# Patient Record
Sex: Female | Born: 1980 | Race: White | Hispanic: Yes | Marital: Married | Smoking: Never smoker
Health system: Southern US, Community
[De-identification: ages and names within clinical notes are randomized; demographics above are authoritative.]

## PROBLEM LIST (undated history)

## (undated) DIAGNOSIS — I10 Essential (primary) hypertension: Secondary | ICD-10-CM

## (undated) DIAGNOSIS — D649 Anemia, unspecified: Secondary | ICD-10-CM

## (undated) DIAGNOSIS — Z789 Other specified health status: Secondary | ICD-10-CM

## (undated) HISTORY — PX: OTHER SURGICAL HISTORY: SHX169

## (undated) HISTORY — DX: Other specified health status: Z78.9

## (undated) HISTORY — DX: Anemia, unspecified: D64.9

## (undated) HISTORY — DX: Essential (primary) hypertension: I10

---
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
## 2002-03-30 ENCOUNTER — Ambulatory Visit (HOSPITAL_COMMUNITY): Admission: RE | Admit: 2002-03-30 | Discharge: 2002-03-30 | Payer: Self-pay | Admitting: *Deleted

## 2002-07-24 ENCOUNTER — Inpatient Hospital Stay (HOSPITAL_COMMUNITY): Admission: AD | Admit: 2002-07-24 | Discharge: 2002-07-24 | Payer: Self-pay | Admitting: *Deleted

## 2002-07-31 ENCOUNTER — Inpatient Hospital Stay (HOSPITAL_COMMUNITY): Admission: AD | Admit: 2002-07-31 | Discharge: 2002-07-31 | Payer: Self-pay | Admitting: Family Medicine

## 2002-09-16 ENCOUNTER — Inpatient Hospital Stay (HOSPITAL_COMMUNITY): Admission: AD | Admit: 2002-09-16 | Discharge: 2002-09-18 | Payer: Self-pay | Admitting: *Deleted

## 2003-10-26 ENCOUNTER — Ambulatory Visit (HOSPITAL_COMMUNITY): Admission: RE | Admit: 2003-10-26 | Discharge: 2003-10-26 | Payer: Self-pay | Admitting: *Deleted

## 2003-11-22 ENCOUNTER — Ambulatory Visit (HOSPITAL_COMMUNITY): Admission: RE | Admit: 2003-11-22 | Discharge: 2003-11-22 | Payer: Self-pay | Admitting: *Deleted

## 2004-04-08 ENCOUNTER — Inpatient Hospital Stay (HOSPITAL_COMMUNITY): Admission: AD | Admit: 2004-04-08 | Discharge: 2004-04-11 | Payer: Self-pay | Admitting: *Deleted

## 2004-04-08 ENCOUNTER — Ambulatory Visit: Payer: Self-pay | Admitting: *Deleted

## 2006-10-12 ENCOUNTER — Ambulatory Visit: Payer: Self-pay | Admitting: Internal Medicine

## 2007-01-18 ENCOUNTER — Ambulatory Visit (HOSPITAL_COMMUNITY): Admission: RE | Admit: 2007-01-18 | Discharge: 2007-01-18 | Payer: Self-pay | Admitting: Obstetrics & Gynecology

## 2007-05-19 ENCOUNTER — Inpatient Hospital Stay (HOSPITAL_COMMUNITY): Admission: AD | Admit: 2007-05-19 | Discharge: 2007-05-20 | Payer: Self-pay | Admitting: Obstetrics & Gynecology

## 2007-05-19 ENCOUNTER — Ambulatory Visit: Payer: Self-pay | Admitting: *Deleted

## 2008-06-19 ENCOUNTER — Ambulatory Visit: Payer: Self-pay | Admitting: Internal Medicine

## 2008-06-19 ENCOUNTER — Encounter: Payer: Self-pay | Admitting: Internal Medicine

## 2008-06-20 ENCOUNTER — Ambulatory Visit: Payer: Self-pay | Admitting: *Deleted

## 2008-06-22 ENCOUNTER — Encounter: Admission: RE | Admit: 2008-06-22 | Discharge: 2008-06-22 | Payer: Self-pay | Admitting: Internal Medicine

## 2008-07-09 ENCOUNTER — Ambulatory Visit: Payer: Self-pay | Admitting: Internal Medicine

## 2008-09-18 ENCOUNTER — Telehealth (INDEPENDENT_AMBULATORY_CARE_PROVIDER_SITE_OTHER): Payer: Self-pay | Admitting: *Deleted

## 2008-10-12 ENCOUNTER — Ambulatory Visit: Payer: Self-pay | Admitting: Internal Medicine

## 2008-10-18 ENCOUNTER — Encounter: Admission: RE | Admit: 2008-10-18 | Discharge: 2008-10-18 | Payer: Self-pay | Admitting: Internal Medicine

## 2008-12-10 ENCOUNTER — Ambulatory Visit: Payer: Self-pay | Admitting: Internal Medicine

## 2008-12-31 ENCOUNTER — Ambulatory Visit (HOSPITAL_COMMUNITY): Admission: RE | Admit: 2008-12-31 | Discharge: 2008-12-31 | Payer: Self-pay | Admitting: General Surgery

## 2008-12-31 ENCOUNTER — Encounter (INDEPENDENT_AMBULATORY_CARE_PROVIDER_SITE_OTHER): Payer: Self-pay | Admitting: General Surgery

## 2009-05-10 ENCOUNTER — Ambulatory Visit: Payer: Self-pay | Admitting: Internal Medicine

## 2009-05-10 ENCOUNTER — Encounter (INDEPENDENT_AMBULATORY_CARE_PROVIDER_SITE_OTHER): Payer: Self-pay | Admitting: Adult Health

## 2009-05-10 LAB — CONVERTED CEMR LAB
ALT: 42 units/L — ABNORMAL HIGH (ref 0–35)
AST: 30 units/L (ref 0–37)
Albumin: 4.3 g/dL (ref 3.5–5.2)
Alkaline Phosphatase: 63 units/L (ref 39–117)
BUN: 10 mg/dL (ref 6–23)
Basophils Absolute: 0 10*3/uL (ref 0.0–0.1)
Basophils Relative: 0 % (ref 0–1)
CO2: 25 meq/L (ref 19–32)
Calcium: 9.3 mg/dL (ref 8.4–10.5)
Chlamydia, DNA Probe: NEGATIVE
Chloride: 105 meq/L (ref 96–112)
Cholesterol: 250 mg/dL — ABNORMAL HIGH (ref 0–200)
Creatinine, Ser: 0.51 mg/dL (ref 0.40–1.20)
Eosinophils Absolute: 0.4 10*3/uL (ref 0.0–0.7)
Eosinophils Relative: 5 % (ref 0–5)
GC Probe Amp, Genital: NEGATIVE
Glucose, Bld: 83 mg/dL (ref 70–99)
HCT: 40.7 % (ref 36.0–46.0)
HDL: 46 mg/dL (ref >39)
Hemoglobin: 13 g/dL (ref 12.0–15.0)
LDL Cholesterol: 163 mg/dL — ABNORMAL HIGH (ref 0–99)
Lymphocytes Relative: 43 % (ref 12–46)
Lymphs Abs: 2.9 10*3/uL (ref 0.7–4.0)
MCHC: 31.9 g/dL (ref 30.0–36.0)
MCV: 88.5 fL (ref 78.0–100.0)
Monocytes Absolute: 0.6 10*3/uL (ref 0.1–1.0)
Monocytes Relative: 9 % (ref 3–12)
Neutro Abs: 2.9 10*3/uL (ref 1.7–7.7)
Neutrophils Relative %: 42 % — ABNORMAL LOW (ref 43–77)
Pap Smear: NEGATIVE
Platelets: 337 10*3/uL (ref 150–400)
Potassium: 4.1 meq/L (ref 3.5–5.3)
RBC: 4.6 M/uL (ref 3.87–5.11)
RDW: 13.6 % (ref 11.5–15.5)
Sodium: 141 meq/L (ref 135–145)
Total Bilirubin: 0.3 mg/dL (ref 0.3–1.2)
Total CHOL/HDL Ratio: 5.4
Total Protein: 6.8 g/dL (ref 6.0–8.3)
Triglycerides: 205 mg/dL — ABNORMAL HIGH (ref <150)
VLDL: 41 mg/dL — ABNORMAL HIGH (ref 0–40)
WBC: 6.8 10*3/uL (ref 4.0–10.5)

## 2010-05-21 LAB — DIFFERENTIAL
Basophils Absolute: 0 10*3/uL (ref 0.0–0.1)
Basophils Relative: 1 % (ref 0–1)
Eosinophils Absolute: 0.4 10*3/uL (ref 0.0–0.7)
Eosinophils Relative: 4 % (ref 0–5)
Lymphocytes Relative: 47 % — ABNORMAL HIGH (ref 12–46)
Lymphs Abs: 4.2 10*3/uL — ABNORMAL HIGH (ref 0.7–4.0)
Monocytes Absolute: 0.6 10*3/uL (ref 0.1–1.0)
Monocytes Relative: 7 % (ref 3–12)
Neutro Abs: 3.7 10*3/uL (ref 1.7–7.7)
Neutrophils Relative %: 42 % — ABNORMAL LOW (ref 43–77)

## 2010-05-21 LAB — CBC
HCT: 41.3 % (ref 36.0–46.0)
Hemoglobin: 14.1 g/dL (ref 12.0–15.0)
MCHC: 34.2 g/dL (ref 30.0–36.0)
MCV: 88.8 fL (ref 78.0–100.0)
Platelets: 310 10*3/uL (ref 150–400)
RBC: 4.65 MIL/uL (ref 3.87–5.11)
RDW: 12.7 % (ref 11.5–15.5)
WBC: 8.9 10*3/uL (ref 4.0–10.5)

## 2010-11-11 LAB — CBC
HCT: 38.1
Hemoglobin: 12.8
MCHC: 33.7
MCV: 89.4
Platelets: 240
RBC: 4.27
RDW: 13.9
WBC: 9.9

## 2010-11-11 LAB — RPR: RPR Ser Ql: NONREACTIVE

## 2011-01-29 ENCOUNTER — Other Ambulatory Visit: Payer: Self-pay | Admitting: Family Medicine

## 2011-06-15 ENCOUNTER — Emergency Department (HOSPITAL_COMMUNITY)
Admission: EM | Admit: 2011-06-15 | Discharge: 2011-06-15 | Disposition: A | Discharge status: Home / Self Care | Payer: Self-pay | Source: Home / Self Care | Attending: Emergency Medicine | Admitting: Emergency Medicine

## 2011-06-15 ENCOUNTER — Encounter (HOSPITAL_COMMUNITY): Payer: Self-pay

## 2011-06-15 DIAGNOSIS — J029 Acute pharyngitis, unspecified: Secondary | ICD-10-CM

## 2011-06-15 DIAGNOSIS — I889 Nonspecific lymphadenitis, unspecified: Secondary | ICD-10-CM

## 2011-06-15 LAB — POCT RAPID STREP A: Streptococcus, Group A Screen (Direct): POSITIVE — AB

## 2011-06-15 MED ORDER — PENICILLIN V POTASSIUM 500 MG PO TABS: 500.0000 mg | ORAL_TABLET | Freq: Four times a day (QID) | ORAL | Status: AC

## 2011-06-15 MED ORDER — HYDROCODONE-ACETAMINOPHEN 7.5-500 MG/15ML PO SOLN
15.0000 mL | Freq: Three times a day (TID) | ORAL | Status: AC | PRN
Start: 2011-06-15 — End: 2011-06-25

## 2011-06-15 NOTE — ED Notes (Signed)
Throat red and swollen

## 2011-06-15 NOTE — Discharge Instructions (Signed)
Regrese [employer] dias para asegurarnos que  esta infeccion esta mejorando. Si sus sintomas empeoran o no pudiera usted tragar solidos o liquidos tendra que ir al servicio de emergencias   Faringitis (viral y bacteriana) (Pharyngitis, Viral and Bacterial) Es una inflamacin (irritacin) o infeccin de la faringe. Tambin se denomina dolor de garganta.  CAUSAS La mayor parte de las anginas son causadas por virus y son parte de un resfro. Sin embargo, algunas anginas son ocasionadas por la bacteria estreptococo (germen) o por otras bacterias. La causa de la angina tambin puede ser el goteo nasal proveniente de los senos [place], [place] y, en algunos [place], se produce incluso por dormir con la boca abierta. Las anginas infecciosas pueden contagiarse de Neomia Dear persona a otra por la tos, el estornudo y por compartir tasas o cubiertos. TRATAMIENTO Las anginas de causa viral generalmente duran entre 3 y [address] Estas infecciones virales generalmente mejoran sin antibiticos (medicamentos que destruyen grmenes). Las anginas por estreptococo y otras bacterias (grmenes) generalmente mejoran despus de 24 a 48 horas de Microbiologist con antibiticos. INSTRUCCIONES PARA EL CUIDADO DOMICILIARIO  Si en profesional considera que se trata de una infeccin bacteriana o si hay una prueba positiva para Event organiser, Administrator, sports un antibitico. Debe tomar todos los antibiticos Librarian, academic. Si no completa el tratamiento con antibiticos, usted o su hijo podrn enfermar nuevamente. Si usted o su hijo presentan un estreptococo en la garganta y no completan el tratamiento, podran sufrir un trastorno grave en el corazn o en los riones.   Beba gran cantidad de lquidos. Alrededor de 8-10 vasos grandes de agua por da. (Puede ser agua, jugos, licuados de frutas, Kool-aid, [place], [place], etc.).   Utilice los medicamentos de venta libre o de prescripcin para Chief Technology Officer, Copywriter, advertising o la [place], segn se lo indique el profesional que lo asiste.   Descanse lo suficiente.   Hgase grgaras con agua salada (1/2 cucharadita de sal en un vaso de agua) cada 1-2 horas si lo necesita para [employer].   Si el paciente es mayor de [place], ofrzcale caramelos duros o Engineer, manufacturing o pastillas para la tos.  SOLICITE ATENCIN MDICA SI:  Si le aparecen bultos grandes y dolorosos en el cuello.   Presenta una erupcin.   Elimina un esputo verde, marrn-amarillento o sanguinolento.   El beb tiene ms de 3 meses y su temperatura rectal es de 100.5 F (38.1 C) o ms durante ms de 1 da.  SOLICITE ATENCIN MDICA DE INMEDIATO SI:  Siente rigidez en el cuello.   Usted o su hijo babean o no pueden tragar lquidos.   Usted o su hijo vomitan, no pueden retener los [employer].   Usted o su hijo presentan dolor intenso, que no se alivia con los [employer] han recetado.   Usted o su hijo presentan dificultad para respirar (y no se debe a la [place]).   Usted o su hijo no pueden abrir Scientist, product/process development.   Usted o su nio presentan aumento del dolor, hinchazn, enrojecimiento en el cuello.   Tiene fiebre.   Su beb tiene ms de 3 meses y su temperatura rectal es de 102 F (38.9 C) o ms.   Su beb tiene 3 meses o menos y su temperatura rectal es de 100.4 F (38 C) o ms.  EST SEGURO QUE:   Comprende las instrucciones para el alta mdica.   Controlar su enfermedad.  Solicitar atencin mdica de inmediato segn las indicaciones.  Document Released: 11/12/2004 Document Revised: 01/22/2011 [facility] Patient Information 2012 [place], [place].Ganglios linfticos inflamados (Swollen Lymph Nodes) El sistema linftico filtra los lquidos que rodean a las clulas. Es similar al sistema de vasos sanguneos. Estos canales transportan linfa en vez de sangre. El sistema linftico es una parte importante del sistema  inmunitario (el que lucha contra las enfermedades). Cuando las personas hablan de "glndulas inflamadas en el cuello" generalmente se refieren a la inflamacin de los ganglios linfticos. Los ganglios linfticos son como pequeas trampas para las infecciones. Usted y Mining engineer que lo asiste pueden palpar los ganglios linfticos, especialmente los que estn inflamados, en estas zonas: en la ingle, en la axila y en la zona que se encuentra por encima de la clavcula. Tambin podr palparlos en el cuello (zona cervical) y en la parte posterior de la cabeza, justo por encima de la lnea del cuero cabelludo (zona occipital). Los ganglios linfticos inflamados aparecen cuando hay alguna enfermedad en la que el organismo responde con cierto tipo de Automotive engineer. Por ejemplo, los ganglios del cuello pueden inflamarse por la picadura de un insecto o por cualquier infeccin menor en la cabeza. Estos son muy fciles de advertir en los nios que sufren un trastorno leve. Los ganglios linfticos tambin se inflaman cuando hay un tumor o un problema con el sistema linftico, como en la enfermedad de Hodgkin. TRATAMIENTO  La mayor parte de los ganglios inflamados no requieren TEFL teacher. Tendrn que observarse durante un breve perodo, si el profesional lo considera necesario. En general, no es necesaria la observacin.   El profesional que lo asiste podr prescribirle antibiticos (medicamentos que destruyen grmenes). El profesional podr prescribirlos si considera que la inflamacin de los ganglios se debe a una infeccin bacteriana (por grmenes). Los antibiticos no se utilizan si la causa de la inflamacin es un virus.  INSTRUCCIONES PARA EL CUIDADO DOMICILIARIO  Tome los [employer] indic el profesional que lo asiste. Utilice los medicamentos de venta libre o de prescripcin para Chief Technology Officer, Environmental health practitioner o la [place], segn se lo indique el profesional que lo asiste.  SOLICITE ATENCIN MDICA  SI:  La temperatura se eleva sin motivo por encima de 102 F (38.9 C) o segn le indique el profesional que lo asiste.  EST SEGURO QUE:   Comprende las instrucciones para el alta mdica.   Controlar su enfermedad.   Solicitar atencin mdica de inmediato segn las indicaciones.  Document Released: 11/12/2004 Document Revised: 01/22/2011 [facility] Patient Information 2012 [place], [place].Ganglios linfticos inflamados (Swollen Lymph Nodes) El sistema linftico filtra los lquidos que rodean a las clulas. Es similar al sistema de vasos sanguneos. Estos canales transportan linfa en vez de sangre. El sistema linftico es una parte importante del sistema inmunitario (el que lucha contra las enfermedades). Cuando las personas hablan de "glndulas inflamadas en el cuello" generalmente se refieren a la inflamacin de los ganglios linfticos. Los ganglios linfticos son como pequeas trampas para las infecciones. Usted y Mining engineer que lo asiste pueden palpar los ganglios linfticos, especialmente los que estn inflamados, en estas zonas: en la ingle, en la axila y en la zona que se encuentra por encima de la clavcula. Tambin podr palparlos en el cuello (zona cervical) y en la parte posterior de la cabeza, justo por encima de la lnea del cuero cabelludo (zona occipital). Los ganglios linfticos inflamados aparecen cuando hay alguna enfermedad en la que el organismo responde con cierto tipo de reaccin  alrgica. Por ejemplo, los ganglios del cuello pueden inflamarse por la picadura de un insecto o por cualquier infeccin menor en la cabeza. Estos son muy fciles de advertir en los nios que sufren un trastorno leve. Los ganglios linfticos tambin se inflaman cuando hay un tumor o un problema con el sistema linftico, como en la enfermedad de Hodgkin. TRATAMIENTO  La mayor parte de los ganglios inflamados no requieren TEFL teacher. Tendrn que observarse durante un breve perodo, si el profesional  lo considera necesario. En general, no es necesaria la observacin.   El profesional que lo asiste podr prescribirle antibiticos (medicamentos que destruyen grmenes). El profesional podr prescribirlos si considera que la inflamacin de los ganglios se debe a una infeccin bacteriana (por grmenes). Los antibiticos no se utilizan si la causa de la inflamacin es un virus.  INSTRUCCIONES PARA EL CUIDADO DOMICILIARIO  Tome los [employer] indic el profesional que lo asiste. Utilice los medicamentos de venta libre o de prescripcin para Chief Technology Officer, Environmental health practitioner o la [place], segn se lo indique el profesional que lo asiste.  SOLICITE ATENCIN MDICA SI:  La temperatura se eleva sin motivo por encima de 102 F (38.9 C) o segn le indique el profesional que lo asiste.  EST SEGURO QUE:   Comprende las instrucciones para el alta mdica.   Controlar su enfermedad.   Solicitar atencin mdica de inmediato segn las indicaciones.  Document Released: 11/12/2004 Document Revised: 01/22/2011 [facility] Patient Information 2012 [place], [place].

## 2011-06-15 NOTE — ED Provider Notes (Signed)
History     CSN: [id number]  Arrival date & time 06/15/11  1052   First MD Initiated Contact with Patient 06/15/11 1121      Chief Complaint  Patient presents with  . Sore Throat  . Otalgia    (Consider location/radiation/quality/duration/timing/severity/associated sxs/prior treatment) HPI Comments: Patient presents urgent care today complaining of severe sore throat and neck pain that started since Friday. She believes Friday she felt a fever but not felt any further fever since then. She denies any cough or upper congestion. It is just strictly hurts to swallow. She has been able to drink fluids and knee but is very painful when she swallows.  Patient denies cough, shortness of breath abdominal pain or vomiting. Taking some Advil and Tylenol partial relief symptoms seem to be getting worse today is why she decided to come in.  Patient is a 31 y.o. female presenting with pharyngitis and ear pain. The history is provided by the patient.  Sore Throat This is a new problem. The current episode started more than 2 days ago. The problem occurs constantly. The problem has been gradually worsening. Pertinent negatives include no abdominal pain, no headaches and no shortness of breath. The symptoms are aggravated by swallowing. The symptoms are relieved by acetaminophen. She has tried acetaminophen for the symptoms. The treatment provided no relief.  Otalgia Associated symptoms include sore throat. Pertinent negatives include no headaches, no rhinorrhea, no abdominal pain, no cough and no rash.    History reviewed. No pertinent past medical history.  History reviewed. No pertinent past surgical history.  No family history on file.  History  Substance Use Topics  . Smoking status: Never Smoker   . Smokeless tobacco: Not on file  . Alcohol Use: No    OB History    Grav Para Term Preterm Abortions TAB SAB Ect Mult Living                  Review of Systems  Constitutional:  Positive for fever and appetite change. Negative for chills and diaphoresis.  HENT: Positive for ear pain and sore throat. Negative for congestion, rhinorrhea and voice change.   Respiratory: Negative for cough and shortness of breath.   Gastrointestinal: Negative for abdominal pain.  Skin: Negative for rash.  Neurological: Negative for headaches.    Allergies  Review of patient's allergies indicates no known allergies.  Home Medications   Current Outpatient Rx  Name Route Sig Dispense Refill  . HYDROCODONE-ACETAMINOPHEN 7.5-500 MG/15ML PO SOLN Oral Take 15 mLs by mouth every 8 (eight) hours as needed for pain. 120 mL 0  . PENICILLIN V POTASSIUM 500 MG PO TABS Oral Take 1 tablet (500 mg total) by mouth 4 (four) times daily. 40 tablet 0    BP 125/73  Pulse 64  Temp(Src) 98.3 F (36.8 C) (Oral)  Resp 16  SpO2 100%  LMP 05/25/2011  Physical Exam  Nursing note and vitals reviewed. Constitutional: She appears well-developed. She appears distressed.  HENT:  Head: Normocephalic.  Mouth/Throat: Uvula is midline and mucous membranes are normal. Posterior oropharyngeal erythema present. No tonsillar abscesses.    Eyes: Conjunctivae are normal.  Neck: Trachea normal and normal range of motion. Neck supple. No JVD present. No mass present.    Pulmonary/Chest: Effort normal and breath sounds normal. No respiratory distress. She has no wheezes. She has no rales. She exhibits no tenderness.  Abdominal: Soft.  Lymphadenopathy:    She has cervical adenopathy.  Neurological: She is alert.  Skin: No rash noted. No erythema.    ED Course  Procedures (including critical care time)  Labs Reviewed  POCT RAPID STREP A ([facility] URG CARE ONLY) - Abnormal; Notable for the following:    Streptococcus, Group A Screen (Direct) POSITIVE (*)    All other components within normal limits   No results found.   1. Pharyngitis   2. Lymphadenitis       MDM  Pharyngitis with reactive  lymphadenopathy anterior neck. Mild tonsillitis as well. Have been symptomatic for 4 days. Patient is able to swallow fluids and solids but with pain. Tonsillitis is++ bilaterally with reactive lymphadenopathy. Patient encouraged to return in 48 hours for recheck and advised that if worsening swelling or difficulty swallowing or salivating that she should go to the emergency department. Patient agreed with treatment plan turn into days for recheck  Jimmie Molly, MD 06/15/11 1458

## 2011-06-15 NOTE — ED Notes (Signed)
C/o sore throat and ear pain since Friday.  States she thinks she had fever on Friday.  SX are getting worse.

## 2015-02-08 ENCOUNTER — Encounter (HOSPITAL_COMMUNITY): Payer: Self-pay | Admitting: *Deleted

## 2015-02-08 ENCOUNTER — Emergency Department (HOSPITAL_COMMUNITY)
Admission: EM | Admit: 2015-02-08 | Discharge: 2015-02-08 | Disposition: A | Discharge status: Home / Self Care | Payer: No Typology Code available for payment source | Attending: Emergency Medicine | Admitting: Emergency Medicine

## 2015-02-08 ENCOUNTER — Emergency Department (HOSPITAL_COMMUNITY): Payer: No Typology Code available for payment source

## 2015-02-08 DIAGNOSIS — Y998 Other external cause status: Secondary | ICD-10-CM | POA: Diagnosis not present

## 2015-02-08 DIAGNOSIS — S161XXA Strain of muscle, fascia and tendon at neck level, initial encounter: Secondary | ICD-10-CM

## 2015-02-08 DIAGNOSIS — S199XXA Unspecified injury of neck, initial encounter: Secondary | ICD-10-CM | POA: Diagnosis present

## 2015-02-08 DIAGNOSIS — S0990XA Unspecified injury of head, initial encounter: Secondary | ICD-10-CM | POA: Diagnosis not present

## 2015-02-08 DIAGNOSIS — Y9389 Activity, other specified: Secondary | ICD-10-CM | POA: Diagnosis not present

## 2015-02-08 DIAGNOSIS — Y92410 Unspecified street and highway as the place of occurrence of the external cause: Secondary | ICD-10-CM | POA: Insufficient documentation

## 2015-02-08 DIAGNOSIS — S4992XA Unspecified injury of left shoulder and upper arm, initial encounter: Secondary | ICD-10-CM | POA: Diagnosis not present

## 2015-02-08 DIAGNOSIS — M25512 Pain in left shoulder: Secondary | ICD-10-CM

## 2015-02-08 MED ORDER — CYCLOBENZAPRINE HCL 10 MG PO TABS
5.0000 mg | ORAL_TABLET | Freq: Once | ORAL | Status: AC
  Administered 2015-02-08: 5 mg via ORAL
  Filled 2015-02-08: qty 1

## 2015-02-08 MED ORDER — IBUPROFEN 600 MG PO TABS: 600.0000 mg | ORAL_TABLET | Freq: Three times a day (TID) | ORAL | Status: DC

## 2015-02-08 MED ORDER — CYCLOBENZAPRINE HCL 5 MG PO TABS: 5.0000 mg | ORAL_TABLET | Freq: Three times a day (TID) | ORAL | Status: DC | PRN
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
## 2019-01-06 ENCOUNTER — Other Ambulatory Visit: Payer: Self-pay

## 2019-01-06 ENCOUNTER — Ambulatory Visit: Payer: Self-pay | Attending: Family Medicine | Admitting: Family Medicine

## 2019-01-06 ENCOUNTER — Encounter: Payer: Self-pay | Admitting: Family Medicine

## 2019-01-06 VITALS — BP 161/99 | HR 72 | Temp 98.2°F | Resp 18 | Ht 60.0 in | Wt 158.0 lb

## 2019-01-06 DIAGNOSIS — R350 Frequency of micturition: Secondary | ICD-10-CM

## 2019-01-06 DIAGNOSIS — R5383 Other fatigue: Secondary | ICD-10-CM

## 2019-01-06 DIAGNOSIS — Z603 Acculturation difficulty: Secondary | ICD-10-CM

## 2019-01-06 DIAGNOSIS — Z758 Other problems related to medical facilities and other health care: Secondary | ICD-10-CM

## 2019-01-06 DIAGNOSIS — Z789 Other specified health status: Secondary | ICD-10-CM

## 2019-01-06 DIAGNOSIS — R1030 Lower abdominal pain, unspecified: Secondary | ICD-10-CM

## 2019-01-06 DIAGNOSIS — N926 Irregular menstruation, unspecified: Secondary | ICD-10-CM

## 2019-01-06 DIAGNOSIS — R079 Chest pain, unspecified: Secondary | ICD-10-CM

## 2019-01-06 DIAGNOSIS — I10 Essential (primary) hypertension: Secondary | ICD-10-CM

## 2019-01-06 DIAGNOSIS — R519 Headache, unspecified: Secondary | ICD-10-CM

## 2019-01-06 LAB — POCT URINALYSIS DIP (CLINITEK)
Bilirubin, UA: NEGATIVE
Blood, UA: NEGATIVE
Glucose, UA: NEGATIVE mg/dL
Ketones, POC UA: NEGATIVE mg/dL
Leukocytes, UA: NEGATIVE
Nitrite, UA: NEGATIVE
POC PROTEIN,UA: NEGATIVE
Spec Grav, UA: 1.015
Urobilinogen, UA: 0.2 U/dL
pH, UA: 6.5

## 2019-01-06 MED ORDER — AMLODIPINE BESYLATE 5 MG PO TABS: 5.0000 mg | ORAL_TABLET | Freq: Every day | ORAL | 3 refills | Status: DC

## 2019-01-06 NOTE — Patient Instructions (Signed)
Hipertensin en los adultos Hypertension, Adult El trmino hipertensin es otra forma de denominar a la presin arterial elevada. La presin arterial elevada fuerza al corazn a trabajar ms para bombear la sangre. Esto puede causar problemas con el paso del tiempo. Una lectura de presin arterial est compuesta por 2 nmeros. Hay un nmero superior (sistlico) sobre un nmero inferior (diastlico). Lo ideal es tener la presin arterial por debajo de 120/80. Las elecciones saludables pueden ayudar a bajar la presin arterial, o tal vez necesite medicamentos para bajarla. Cules son las causas? Se desconoce la causa de esta afeccin. Algunas afecciones pueden estar relacionadas con la presin arterial alta. Qu incrementa el riesgo?  Fumar.  Tener diabetes mellitus tipo 2, colesterol alto, o ambos.  No hacer la cantidad suficiente de actividad fsica o ejercicio.  Tener sobrepeso.  Consumir mucha grasa, azcar, caloras o sal (sodio) en su dieta.  Beber alcohol en exceso.  Tener una enfermedad renal a largo plazo (crnica).  Tener antecedentes familiares de presin arterial alta.  Edad. Los riesgos aumentan con la edad.  Raza. El riesgo es mayor para las personas afroamericanas.  Sexo. Antes de los 45aos, los hombres corren ms riesgo que las mujeres. Despus de los 65aos, las mujeres corren ms riesgo que los hombres.  Tener apnea obstructiva del sueo.  Estrs. Cules son los signos o los sntomas?  Es posible que la presin arterial alta puede no cause sntomas. La presin arterial muy alta (crisis hipertensiva) puede provocar: ? Dolor de cabeza. ? Sensaciones de preocupacin o nerviosismo (ansiedad). ? Falta de aire. ? Hemorragia nasal. ? Sensacin de malestar en el estmago (nuseas). ? Vmitos. ? Cambios en la forma de ver. ? Dolor muy intenso en el pecho. ? Convulsiones. Cmo se trata?  Esta afeccin se trata haciendo cambios saludables en el estilo de  vida, por ejemplo: ? Consumir alimentos saludables. ? Hacer ms ejercicio. ? Beber menos alcohol.  El mdico puede recetarle medicamentos si los cambios en el estilo de vida no son suficientes para lograr controlar la presin arterial y si: ? El nmero de arriba est por encima de 130. ? El nmero de abajo est por encima de 80.  Su presin arterial personal ideal puede variar. Siga estas instrucciones en su casa: Comida y bebida   Si se lo dicen, siga el plan de alimentacin de DASH (Dietary Approaches to Stop Hypertension, Maneras de alimentarse para detener la hipertensin). Para seguir este plan: ? Llene la mitad del plato de cada comida con frutas y verduras. ? Llene un cuarto del plato de cada comida con cereales integrales. Los cereales integrales incluyen pasta integral, arroz integral y pan integral. ? Coma y beba productos lcteos con bajo contenido de grasa, como leche descremada o yogur bajo en grasas. ? Llene un cuarto del plato de cada comida con protenas bajas en grasa (magras). Las protenas bajas en grasa incluyen pescado, pollo sin piel, huevos, frijoles y tofu. ? Evite consumir carne grasa, carne curada y procesada, o pollo con piel. ? Evite consumir alimentos prehechos o procesados.  Consuma menos de 1500 mg de sal por da.  No beba alcohol si: ? El mdico le indica que no lo haga. ? Est embarazada, puede estar embarazada o est tratando de quedar embarazada.  Si bebe alcohol: ? Limite la cantidad que bebe a lo siguiente:  De 0 a 1 medida por da para las mujeres.  De 0 a 2 medidas por da para los hombres. ? Est atento a   la cantidad de alcohol que hay en las bebidas que toma. En [place], una medida equivale a una botella de cerveza de 12oz (355ml), un vaso de vino de 5oz (148ml) o un vaso de una bebida alcohlica de alta graduacin de 1oz (44ml). Estilo de vida   Trabaje con su mdico para mantenerse en un peso saludable o para perder  peso. Pregntele a su mdico cul es el peso recomendable para usted.  Haga al menos 30minutos de ejercicio la mayora de los das de la semana. Estos pueden incluir caminar, nadar o andar en bicicleta.  Realice al menos 30 minutos de ejercicio que fortalezca sus msculos (ejercicios de resistencia) al menos 3 das a la semana. Estos pueden incluir levantar pesas o hacer Pilates.  No consuma ningn producto que contenga nicotina o tabaco, como cigarrillos, cigarrillos electrnicos y tabaco de mascar. Si necesita ayuda para dejar de fumar, consulte al mdico.  Controle su presin arterial en su casa tal como le indic el mdico.  Concurra a todas las visitas de seguimiento como se lo haya indicado el mdico. Esto es importante. Medicamentos  Tome los medicamentos de venta libre y los recetados solamente como se lo haya indicado el mdico. Siga cuidadosamente las indicaciones.  No omita las dosis de medicamentos para la presin arterial. Los medicamentos pierden eficacia si omite dosis. El hecho de omitir las dosis tambin aumenta el riesgo de otros problemas.  Pregntele a su mdico a qu efectos secundarios o reacciones a los medicamentos debe prestar atencin. Comunquese con un mdico si:  Piensa que tiene una reaccin a los medicamentos que est tomando.  Tiene dolores de cabeza frecuentes (recurrentes).  Se siente mareado.  Tiene hinchazn en los tobillos.  Tiene problemas de visin. Solicite ayuda inmediatamente si:  Siente un dolor de cabeza muy intenso.  Empieza a sentirse desorientado (confundido).  Se siente dbil o adormecido.  Siente que va a desmayarse.  Tiene un dolor muy intenso en las siguientes zonas: ? Pecho. ? Vientre (abdomen).  Vomita ms de una vez.  Tiene dificultad para respirar. Resumen  El trmino hipertensin es otra forma de denominar a la presin arterial elevada.  La presin arterial elevada fuerza al corazn a trabajar ms para bombear  la sangre.  Para la mayora de las personas, una presin arterial normal es menor que 120/80.  Las decisiones saludables pueden ayudarle a disminuir su presin arterial. Si no puede bajar su presin arterial mediante decisiones saludables, es posible que deba tomar medicamentos. Esta informacin no tiene como fin reemplazar el consejo del mdico. Asegrese de hacerle al mdico cualquier pregunta que tenga. Document Released: 07/23/2009 Document Revised: 11/18/2017 Document Reviewed: 11/18/2017 Elsevier Patient Education  2020 [employer] Inc.  

## 2019-01-06 NOTE — Progress Notes (Signed)
Subjective:  Patient ID: Tracey Gill, female    DOB: 08-Mar-1980  Age: 38 y.o. MRN: [id number]   Due to language barrier, [employer] video interpretation system used at today's visit  CC: Hypertension   HPI Tracey Gill, 38 year old Hispanic female, who is new to the practice who presents secondary to complaint of chronic daily headaches.  She also reports issues with abdominal pain which occurs in her right lower abdomen and middle of the abdomen.  She also has had issues with ongoing fatigue.  She reports no prior medical history/medical issues.  She is not currently on any chronic medications.  She reports that headaches are generalized and dull, sometimes slightly throbbing.  In the past, headaches were occurring randomly and less frequently but over the past month or so, she feels as if she has a headache on a daily basis.  Headaches can range from a 6-8 on a 0-to-10 scale.  Headaches are sometimes slightly decreased with use of over-the-counter medication.  She also occasionally has dizziness when she has headaches.  Dizziness tends to occur with changes in position.  Dizziness does not last more than a few seconds.  She denies any changes in vision or hearing.  She has had no episodes of focal numbness or weakness.         She reports that fatigue has been ongoing greater than 6 months.  She just feels tired in general.  She feels that she is sleeping well/gets adequate sleep.  She does tend to worry somewhat.  She does not believe that she is currently pregnant.  She does have issues with irregular menses however but had her last menses on 12/22/2018.  She denies shortness of breath or cough.  She does occasionally have some pain in her chest which can occur on both sides.  Chest pain occurs randomly.  Chest pain is more likely to occur after she has been more active.  She did recently start lifting weights at home for exercise.         She reports that her lower abdominal pain is  slightly crampy in nature.  Abdominal pain ranges from about a 4-6 on a 0-to-10 scale.  She denies any current nausea or vomiting, no fever or chills.  No constipation or diarrhea.  She has had no blood in the stool and no dark stools.  She has noticed that she has to urinate more frequently.  She does not feel discomfort with urination but sometimes feels as if she has mild increase lower abdominal pressure prior to urination.  Past Medical History:  Diagnosis Date  . Known health problems: none     Past Surgical History:  Procedure Laterality Date  . removal of benign right breast mass      Family History  Problem Relation Age of Onset  . Diabetes Mother   . Heart disease Father   . Diabetes Sister     Social History   Tobacco Use  . Smoking status: Never Smoker  . Smokeless tobacco: Never Used  Substance Use Topics  . Alcohol use: No    ROS Review of Systems  Constitutional: Positive for fatigue. Negative for chills and fever.  HENT: Negative for sore throat and trouble swallowing.   Eyes: Negative for photophobia and visual disturbance.  Cardiovascular: Positive for chest pain. Negative for palpitations and leg swelling.  Gastrointestinal: Positive for abdominal pain (across right and mid lower abdomen). Negative for blood in stool, constipation, diarrhea and nausea.  Endocrine: Positive  for polyuria. Negative for cold intolerance, heat intolerance, polydipsia and polyphagia.  Genitourinary: Positive for frequency. Negative for dysuria.  Musculoskeletal: Negative for arthralgias and back pain.  Skin: Negative for rash and wound.  Neurological: Positive for dizziness and headaches.  Hematological: Negative for adenopathy. Does not bruise/bleed easily.    Objective:   Today's Vitals: BP (!) 161/99 (BP Location: Left Arm, Patient Position: Sitting, Cuff Size: Normal)   Pulse 72   Temp 98.2 F (36.8 C) (Oral)   Resp 18   Ht 5' (1.524 m)   Wt 158 lb (71.7 kg)   LMP  12/22/2018   SpO2 99%   BMI 30.86 kg/m  repeat BP 163/103  Physical Exam Constitutional:      General: She is not in acute distress.    Appearance: Normal appearance. She is normal weight. She is not ill-appearing.  Neck:     Musculoskeletal: Normal range of motion and neck supple. No neck rigidity or muscular tenderness.     Vascular: No carotid bruit.  Cardiovascular:     Rate and Rhythm: Normal rate and regular rhythm.  Pulmonary:     Effort: Pulmonary effort is normal.     Breath sounds: Normal breath sounds.  Abdominal:     Palpations: Abdomen is soft.     Tenderness: There is abdominal tenderness (right lower and suprapubic area). There is no right CVA tenderness, left CVA tenderness, guarding or rebound.  Musculoskeletal:        General: No tenderness or deformity.     Right lower leg: No edema.     Left lower leg: No edema.  Lymphadenopathy:     Cervical: No cervical adenopathy.  Skin:    General: Skin is warm and dry.  Neurological:     General: No focal deficit present.     Mental Status: She is alert and oriented to person, place, and time.     Cranial Nerves: No cranial nerve deficit.  Psychiatric:        Mood and Affect: Mood normal.        Behavior: Behavior normal.     Assessment & Plan:  1. Chronic daily headache; 2.  Essential hypertension Patient was complaining of chronic daily headaches which are generalized.  Patient had blood pressure checked x2 here in the office and patient's blood pressure is elevated.  Discussed with patient that she likely has hypertension as the cause of her daily headaches.  She will be placed on amlodipine 5 mg daily and educational information in Spanish provided to the patient regarding hypertension.  She has been asked to follow-up in the next 3 to 4 weeks regarding hypertension, sooner if continued headaches, worsening of headaches or any difficulty tolerating the medication.  She has also been advised to go to the emergency  department if she has increased headache, dizziness, focal numbness or weakness, visual changes or any other concerns. -Amlodipine 5 mg, 1 by mouth once daily to lower blood pressure #30; refill: 1  3. Fatigue, unspecified type Patient with complaint of issues with fatigue.  Will check TSH to look for thyroid disorder, CBC to look for anemia or blood disorder, vitamin D level to look for vitamin D deficiency and BMP to look for electrolyte abnormality/elevated blood sugar as patient also with complaint of frequent urination. - TSH - CBC - Vitamin D, 25-hydroxy - BMP  4. Chest pain, unspecified type Patient with complaint of issues with chest pain.  She does not believe that she is  having any acid reflux symptoms.  On examination, chest pain is more likely to be musculoskeletal but will also check CBC to look for anemia and BMP to look for electrolyte abnormality which could be contributing to her issues with chest pain. - CBC - Basic Metabolic Panel  5. Irregular menses She reports issues with irregular menses and she will have TSH at today's visit to look for possible thyroid disorder as a contributing factor.  Consider GYN follow-up if symptoms continue.  Patient was also asked to consider use of birth control pills to help with regulation of menses. - TSH  6. Lower abdominal pain 7. Urinary frequency Patient with complaint of lower abdominal pain and she also has urinary frequency.  Will check urinalysis to look for possible urinary tract infection as well as BMP to look for electrolyte abnormality which may be contributing to her urinary frequency.  CBC to look for elevated white blood cell count/anemia which might be related to her abdominal pain.  ENT follow-up if any acute worsening of abdominal pain or any concerns. - Basic Metabolic Panel - POCT URINALYSIS DIP (CLINITEK)  8. Language barrier Interpretation system used at today's visit to help with language barrier in communication  of healthcare information  Outpatient Encounter Medications as of 01/06/2019  Medication Sig  . amLODipine (NORVASC) 5 MG tablet Take 1 tablet (5 mg total) by mouth daily. To lower blood pressure  . [DISCONTINUED] cyclobenzaprine (FLEXERIL) 5 MG tablet Take 1 tablet (5 mg total) by mouth 3 (three) times daily as needed for muscle spasms.  . [DISCONTINUED] ibuprofen (ADVIL,MOTRIN) 600 MG tablet Take 1 tablet (600 mg total) by mouth 3 (three) times daily.   No facility-administered encounter medications on file as of 01/06/2019.     An After Visit Summary was printed and given to the patient.   Follow-up: Return in about 4 weeks (around 02/03/2019) for HTN/headaches and abdominal pain.  Follow-up sooner if needed.  Emergency department if worsening headache/abdominal pain or any concerns.   Antony Blackbird MD

## 2019-01-07 LAB — CBC
Hematocrit: 36.9 % (ref 34.0–46.6)
Hemoglobin: 12.6 g/dL (ref 11.1–15.9)
MCH: 29.6 pg (ref 26.6–33.0)
MCHC: 34.1 g/dL (ref 31.5–35.7)
MCV: 87 fL (ref 79–97)
Platelets: 343 x10E3/uL (ref 150–450)
RBC: 4.26 x10E6/uL (ref 3.77–5.28)
RDW: 13.1 % (ref 11.7–15.4)
WBC: 7.4 x10E3/uL (ref 3.4–10.8)

## 2019-01-07 LAB — BASIC METABOLIC PANEL WITH GFR
BUN/Creatinine Ratio: 15 (ref 9–23)
BUN: 8 mg/dL (ref 6–20)
CO2: 27 mmol/L (ref 20–29)
Calcium: 9.2 mg/dL (ref 8.7–10.2)
Chloride: 102 mmol/L (ref 96–106)
Creatinine, Ser: 0.52 mg/dL — ABNORMAL LOW (ref 0.57–1.00)
GFR calc Af Amer: 142 mL/min/1.73
GFR calc non Af Amer: 123 mL/min/1.73
Glucose: 80 mg/dL (ref 65–99)
Potassium: 4.1 mmol/L (ref 3.5–5.2)
Sodium: 139 mmol/L (ref 134–144)

## 2019-01-07 LAB — VITAMIN D 25 HYDROXY (VIT D DEFICIENCY, FRACTURES): Vit D, 25-Hydroxy: 21.7 ng/mL — ABNORMAL LOW (ref 30.0–100.0)

## 2019-01-07 LAB — TSH: TSH: 1.57 u[IU]/mL (ref 0.450–4.500)

## 2019-01-08 ENCOUNTER — Other Ambulatory Visit: Payer: Self-pay | Admitting: Family Medicine

## 2019-01-08 DIAGNOSIS — E559 Vitamin D deficiency, unspecified: Secondary | ICD-10-CM

## 2019-01-08 MED ORDER — VITAMIN D (ERGOCALCIFEROL) 1.25 MG (50000 UNIT) PO CAPS: 50000.0000 [IU] | ORAL_CAPSULE | ORAL | 3 refills | Status: DC

## 2019-01-08 NOTE — Progress Notes (Signed)
Patient ID: Tracey Gill, female   DOB: [birth year], 38 y.o.   MRN: [id number]   Patient with low vitamin D level on recent labs and prescription will be sent to patient's pharmacy for vitamin D replacement therapy.

## 2019-01-09 ENCOUNTER — Telehealth: Payer: Self-pay | Admitting: *Deleted

## 2019-01-09 NOTE — Telephone Encounter (Signed)
-----   Message from Antony Blackbird, MD sent at 01/08/2019 11:50 PM EST ----- Normal thyroid blood test.  Normal complete blood count.  Normal basic metabolic panel/electrolytes.  Normal urinalysis.  Vitamin D level is 21.7 which is low and vitamin D replacement therapy will be sent to patient's pharmacy.

## 2019-01-09 NOTE — Telephone Encounter (Signed)
Medical Assistant used [employer] to contact patient.  Interpreter Name: Leatrice Jewels Interpreter #: [id number] Patient verified DOB Patient is aware of TSH, CBC, CMP and UA being normal. Patient is aware of needing to take Vitamin D weekly to address low vitamin d level.

## 2019-02-03 ENCOUNTER — Ambulatory Visit: Payer: Self-pay

## 2019-02-03 ENCOUNTER — Encounter: Payer: Self-pay | Admitting: Family Medicine

## 2019-02-03 ENCOUNTER — Other Ambulatory Visit: Payer: Self-pay

## 2019-02-03 ENCOUNTER — Ambulatory Visit: Payer: Self-pay | Attending: Family Medicine | Admitting: Family Medicine

## 2019-02-03 DIAGNOSIS — I10 Essential (primary) hypertension: Secondary | ICD-10-CM

## 2019-02-03 DIAGNOSIS — R519 Headache, unspecified: Secondary | ICD-10-CM

## 2019-02-03 DIAGNOSIS — R1032 Left lower quadrant pain: Secondary | ICD-10-CM

## 2019-02-03 DIAGNOSIS — R108A3 Suprapubic tenderness: Secondary | ICD-10-CM

## 2019-02-03 DIAGNOSIS — N926 Irregular menstruation, unspecified: Secondary | ICD-10-CM

## 2019-02-03 DIAGNOSIS — R10819 Abdominal tenderness, unspecified site: Secondary | ICD-10-CM

## 2019-02-03 DIAGNOSIS — N925 Other specified irregular menstruation: Secondary | ICD-10-CM

## 2019-02-03 LAB — POCT URINALYSIS DIP (CLINITEK)
Bilirubin, UA: NEGATIVE
Blood, UA: NEGATIVE
Glucose, UA: NEGATIVE mg/dL
Ketones, POC UA: NEGATIVE mg/dL
Leukocytes, UA: NEGATIVE
Nitrite, UA: NEGATIVE
POC PROTEIN,UA: NEGATIVE
Spec Grav, UA: >=1.03 — AB
Urobilinogen, UA: 0.2 U/dL
pH, UA: 6

## 2019-02-03 MED ORDER — AMLODIPINE BESYLATE 5 MG PO TABS: 5.0000 mg | ORAL_TABLET | Freq: Every day | ORAL | 1 refills | Status: DC

## 2019-02-03 MED ORDER — SULFAMETHOXAZOLE-TRIMETHOPRIM 800-160 MG PO TABS: 1.0000 | ORAL_TABLET | Freq: Two times a day (BID) | ORAL | 0 refills | Status: DC

## 2019-02-03 MED FILL — AMLODIPINE BESYLATE 5 MG TA: 5 | 30 days supply | Qty: 30 | Fill #0

## 2019-02-03 MED FILL — SULFAMETHOXAZOLE-TMP DS TAB: 800-160 | 3 days supply | Qty: 6 | Fill #0

## 2019-02-03 NOTE — Progress Notes (Signed)
Patient verified DOB Patient has eaten today. Patient has taken medication today. Patient denies pain at this time.

## 2019-02-03 NOTE — Progress Notes (Signed)
Virtual Visit via Telephone Note  I connected with Tracey Gill on 02/03/19 at  9:50 AM EST by telephone and verified that I am speaking with the correct person using two identifiers.   I discussed the limitations, risks, security and privacy concerns of performing an evaluation and management service by telephone and the availability of in person appointments. I also discussed with the patient that there may be a patient responsible charge related to this service. The patient expressed understanding and agreed to proceed.  Patient Location: Home Provider Location: [facility] Others participating in call: call was initiated by [name], [name] who also obtained a Spanish speaking interpreter through [employer] due to a language barrier   History of Present Illness:       38 yo female who is seen in follow-up of a visit in November at which time patient reported having chronic daily headaches and patient was found to have hypertension and started on amlodipine 5 mg.  She also had complained at that time of fatigue and blood work done which showed vitamin D deficiency.  Patient reports she is started vitamin D replacement therapy and now feels better.  Headaches are better and no longer daily and the prescribed medication helps.  Patient has not checked her blood pressure anywhere but feels that her blood pressure has improved on the medication.        She has a complaint of having generalized and left lower abdominal pain for the past month which feels similar to when she had issues with ovarian cyst.  Patient believes that the pain is in her left ovary.  She denies any nausea/vomiting/diarrhea or constipation.  No blood in the stool or black stools.  She denies any urinary frequency, urgency or dysuria.  Pain occurs on a daily basis and can be sharp.  Pain can be a 6 or higher on a 0-to-10 scale.  She is feeling anxious because of the abdominal pain.  She denies any  possibility of pregnancy as she states that her husband has had a vasectomy.  She also reports that her menses are chronically irregular as her menses may occur every other month.  She did have menses about 2 weeks ago.   Past Medical History:  Diagnosis Date  . Known health problems: none     Past Surgical History:  Procedure Laterality Date  . removal of benign right breast mass      Family History  Problem Relation Age of Onset  . Diabetes Mother   . Heart disease Father   . Diabetes Sister     Social History   Tobacco Use  . Smoking status: Never Smoker  . Smokeless tobacco: Never Used  Substance Use Topics  . Alcohol use: No  . Drug use: No     No Known Allergies     Observations/Objective: No vital signs or physical exam conducted as visit was done via telephone  [employer] video interpretation system used in office: Patient was asked to come into the office for labs and while here, BP was obtained which was 166/96 and patient requested BP medication refill  On exam:  GEN; WNWD female in NAD Lungs-CTA bilat CV- RRR Back- no CVA tenderness ABD- tender to palpation in right medial/suprapubic and left lower abdomen; no rebound or guarding EXT- no peripheral edema  Assessment and Plan: 1. Left lower quadrant abdominal pain Patient with complaint of left lower quadrant pain which was also present on exam. Urine HCG  was ordered but did not appear to have been done during patient's lab visit.  She reports menses 2 weeks ago and is likely not currently pregnant.  UA was obtained which was normal but will send for culture to see if there is growth of bacteria consistent with UTI as she also has some suprapubic area tenderness.  Prescription for 3 days of Bactrim DS which would treat an uncomplicated urinary tract infection while culture is pending.  She feels that her pain is similar to pain she is experienced in the past with presence of ovarian cyst and patient will be  scheduled for ultrasound of the pelvis and patient was made aware during her visit of the date and time of her upcoming ultrasound.  If she has acute worsening of abdominal pain, she is aware that she should go to the emergency department for further evaluation and treatment.  Otherwise she will be notified of the ultrasound results and if further treatment is needed based on these results as well as if additional antibiotic therapy is needed based on urine culture results.  She will be notified of the results of pelvic ultrasound and if GYN follow-up is needed based on the results. - US Pelvic Complete With Transvaginal; Future - POCT urine pregnancy - POCT URINALYSIS DIP (CLINITEK) - Urine Culture - sulfamethoxazole-trimethoprim (BACTRIM DS) 800-160 MG tablet; Take 1 tablet by mouth 2 (two) times daily.  Dispense: 6 tablet; Refill: 0  2. Essential hypertension She appears to be doing well on amlodipine 5 mg for treatment of hypertension which she will continue along with a low-sodium diet. - amLODipine (NORVASC) 5 MG tablet; Take 1 tablet (5 mg total) by mouth daily. To lower blood pressure  Dispense: 90 tablet; Refill: 1  3. Chronic daily headache She reports improvement in chronic daily headache now that her blood pressure is controlled with medication, amlodipine.  4. Irregular menses Patient with complaint of irregular menses but did have.  Approximately 2 weeks ago.  Urine pregnancy test was ordered but this does not appear to have been performed at today's visit.  Patient will also have pelvic ultrasound to look for any other abnormality that may be contributing to her irregular menses. - POCT urine pregnancy  5. Suprapubic tenderness Patient with suprapubic tenderness and urinalysis done which was normal.  She will be placed on Bactrim DS twice daily x3 days to treat possible uncomplicated urinary tract infection but will be notified if additional therapy is needed based on the culture  results.  She is encouraged to remain well-hydrated with water. - Urine Culture - sulfamethoxazole-trimethoprim (BACTRIM DS) 800-160 MG tablet; Take 1 tablet by mouth 2 (two) times daily.  Dispense: 6 tablet; Refill: 0  Follow Up Instructions:Return in about 4 weeks (around 03/03/2019) for lower abd pain/HTN.    I discussed the assessment and treatment plan with the patient. The patient was provided an opportunity to ask questions and all were answered. The patient agreed with the plan and demonstrated an understanding of the instructions.   The patient was advised to call back or seek an in-person evaluation if the symptoms worsen or if the condition fails to improve as anticipated.  I provided 22 minutes of non-face-to-face time during this encounter. Additional 12 minutes of face to face to time with patient in office for exam and review of assessment and plan.    Addendum: On pelvic ultrasound done 02/14/2019, patient with a 5.1 cm intramural fibroid at the left uterine body.  Also  with presence of a complex right ovarian cyst, indeterminate, but favored to reflect a benign hemorrhagic cyst.  Normal left ovary.  She will be referred to gynecology for further evaluation and treatment.   Antony Blackbird, MD

## 2019-02-05 LAB — URINE CULTURE: Organism ID, Bacteria: NO GROWTH

## 2019-02-14 ENCOUNTER — Other Ambulatory Visit: Payer: Self-pay

## 2019-02-14 ENCOUNTER — Ambulatory Visit (HOSPITAL_COMMUNITY)
Admission: RE | Admit: 2019-02-14 | Discharge: 2019-02-14 | Disposition: A | Discharge status: Home / Self Care | Payer: Self-pay | Source: Ambulatory Visit | Attending: Family Medicine | Admitting: Family Medicine

## 2019-02-14 DIAGNOSIS — R1032 Left lower quadrant pain: Secondary | ICD-10-CM | POA: Insufficient documentation

## 2019-03-03 ENCOUNTER — Ambulatory Visit: Payer: Self-pay | Admitting: Family Medicine

## 2019-03-17 ENCOUNTER — Other Ambulatory Visit: Payer: Self-pay

## 2019-03-17 ENCOUNTER — Encounter: Payer: Self-pay | Admitting: Family Medicine

## 2019-03-17 ENCOUNTER — Ambulatory Visit: Payer: Self-pay | Attending: Family Medicine | Admitting: Family Medicine

## 2019-03-17 VITALS — BP 153/76 | HR 66 | Ht 60.0 in | Wt 161.0 lb

## 2019-03-17 DIAGNOSIS — I10 Essential (primary) hypertension: Secondary | ICD-10-CM

## 2019-03-17 DIAGNOSIS — N83201 Unspecified ovarian cyst, right side: Secondary | ICD-10-CM

## 2019-03-17 DIAGNOSIS — Z603 Acculturation difficulty: Secondary | ICD-10-CM

## 2019-03-17 DIAGNOSIS — R1030 Lower abdominal pain, unspecified: Secondary | ICD-10-CM

## 2019-03-17 DIAGNOSIS — Z758 Other problems related to medical facilities and other health care: Secondary | ICD-10-CM

## 2019-03-17 DIAGNOSIS — D251 Intramural leiomyoma of uterus: Secondary | ICD-10-CM

## 2019-03-17 DIAGNOSIS — Z789 Other specified health status: Secondary | ICD-10-CM

## 2019-03-17 LAB — POCT URINALYSIS DIP (CLINITEK)
Bilirubin, UA: NEGATIVE
Blood, UA: NEGATIVE
Glucose, UA: NEGATIVE mg/dL
Ketones, POC UA: NEGATIVE mg/dL
Leukocytes, UA: NEGATIVE
Nitrite, UA: NEGATIVE
POC PROTEIN,UA: NEGATIVE
Spec Grav, UA: 1.025
Urobilinogen, UA: 0.2 U/dL
pH, UA: 6

## 2019-03-17 NOTE — Progress Notes (Signed)
Established Patient Office Visit  Subjective:  Patient ID: Tracey Gill, female    DOB: 10-14-1980  Age: 39 y.o. MRN: [id number]  CC:  Chief Complaint  Patient presents with  . Hypertension   Due to language barrier, patient was offered use of [employer] video interpretation system which she declined as she preferred having her daughter as Optometrist at today's visit.  Daughter and patient did sign form regarding declining translation services.  HPI Tracey Gill, 39 year old Hispanic female, who is seen in follow-up of hypertension for which she is currently on amlodipine as well as follow-up of generalized and left lower abdominal pain.  She reported at her telemedicine visit on 02/03/2019 that the pain in her lower abdomen was similar to pain she experienced in the past when she had an ovarian cyst.  She is status post pelvic ultrasound and received information regarding the findings and has been notified regarding upcoming appointment with GYN.  At today's visit, she reports that she continues to have lower abdominal pain which can occur on both the left and right lower abdomen as well as in the mid abdomen.  Pain is crampy and similar to childbirth.  Pain previously would come and go but she feels that the pain is now constant over the past 2 weeks.  She reports that the pain is about an 8-9 on a 0-to-10 scale with 0 being no pain and 10 being the worst imaginable pain.  She does get some reduction in pain with the use of over-the-counter naproxen.          She reports that she has been taking her blood pressure medication and that starting the medication for her hypertension did stop the previous headaches that she was having.  She denies any issues with the medication.  No current headaches or dizziness.  She has had some fatigue.  Past Medical History:  Diagnosis Date  . Known health problems: none     Past Surgical History:  Procedure Laterality Date  . removal of  benign right breast mass      Family History  Problem Relation Age of Onset  . Diabetes Mother   . Heart disease Father   . Diabetes Sister     Social History   Socioeconomic History  . Marital status: Married    Spouse name: Not on file  . Number of children: Not on file  . Years of education: Not on file  . Highest education level: Not on file  Occupational History  . Not on file  Tobacco Use  . Smoking status: Never Smoker  . Smokeless tobacco: Never Used  Substance and Sexual Activity  . Alcohol use: No  . Drug use: No  . Sexual activity: Yes  Other Topics Concern  . Not on file  Social History Narrative  . Not on file   Social Determinants of Health   Financial Resource Strain:   . Difficulty of Paying Living Expenses: Not on file  Food Insecurity:   . Worried About Charity fundraiser in the Last Year: Not on file  . Ran Out of Food in the Last Year: Not on file  Transportation Needs:   . Lack of Transportation (Medical): Not on file  . Lack of Transportation (Non-Medical): Not on file  Physical Activity:   . Days of Exercise per Week: Not on file  . Minutes of Exercise per Session: Not on file  Stress:   . Feeling of Stress : Not on file  Social Connections:   . Frequency of Communication with Friends and Family: Not on file  . Frequency of Social Gatherings with Friends and Family: Not on file  . Attends Religious Services: Not on file  . Active Member of Clubs or Organizations: Not on file  . Attends Archivist Meetings: Not on file  . Marital Status: Not on file  Intimate Partner Violence:   . Fear of Current or Ex-Partner: Not on file  . Emotionally Abused: Not on file  . Physically Abused: Not on file  . Sexually Abused: Not on file    Outpatient Medications Prior to Visit  Medication Sig Dispense Refill  . amLODipine (NORVASC) 5 MG tablet Take 1 tablet (5 mg total) by mouth daily. To lower blood pressure 90 tablet 1  . Vitamin D,  Ergocalciferol, (DRISDOL) 1.25 MG (50000 UT) CAPS capsule Take 1 capsule (50,000 Units total) by mouth every 7 (seven) days. 5 capsule 3  . sulfamethoxazole-trimethoprim (BACTRIM DS) 800-160 MG tablet Take 1 tablet by mouth 2 (two) times daily. (Patient not taking: Reported on 03/17/2019) 6 tablet 0   No facility-administered medications prior to visit.    No Known Allergies  ROS Review of Systems  Constitutional: Positive for fatigue. Negative for chills and fever.  HENT: Negative for sore throat and trouble swallowing.   Respiratory: Negative for cough and shortness of breath.   Cardiovascular: Negative for chest pain, palpitations and leg swelling.  Gastrointestinal: Positive for abdominal pain. Negative for blood in stool, constipation, diarrhea and nausea.  Endocrine: Negative for cold intolerance, heat intolerance, polydipsia, polyphagia and polyuria.  Genitourinary: Positive for pelvic pain. Negative for dysuria, frequency and vaginal discharge.  Musculoskeletal: Negative for arthralgias and back pain.  Neurological: Negative for dizziness and headaches.  Hematological: Negative for adenopathy. Does not bruise/bleed easily.      Objective:    Physical Exam  Constitutional: She is oriented to person, place, and time. She appears well-developed and well-nourished.  Neck: No JVD present. No thyromegaly present.  Cardiovascular: Normal rate and regular rhythm.  Pulmonary/Chest: Effort normal and breath sounds normal.  Abdominal: Soft. There is abdominal tenderness. There is no rebound and no guarding.  Patient with right lower quadrant and left lower quadrant tenderness to palpation as well as mild suprapubic discomfort.  No rebound or guarding.  Musculoskeletal:        General: No tenderness or edema.     Cervical back: Normal range of motion and neck supple.  Lymphadenopathy:    She has no cervical adenopathy.  Neurological: She is alert and oriented to person, place, and  time.  Skin: Skin is warm and dry.  Psychiatric: She has a normal mood and affect. Her behavior is normal.  Nursing note and vitals reviewed.  BP (!) 153/76   Pulse 66   Ht 5' (1.524 m)   Wt 161 lb (73 kg)   SpO2 100%   BMI 31.44 kg/m  Wt Readings from Last 3 Encounters:  03/17/19 161 lb (73 kg)  01/06/19 158 lb (71.7 kg)  02/08/15 160 lb (72.6 kg)     Health Maintenance Due  Topic Date Due  . HIV Screening  07/30/1997  . PAP SMEAR-Modifier  01/28/2014    Lab Results  Component Value Date   TSH 1.570 01/06/2019   Lab Results  Component Value Date   WBC 7.4 01/06/2019   HGB 12.6 01/06/2019   HCT 36.9 01/06/2019   MCV 87 01/06/2019   PLT 343  01/06/2019   Lab Results  Component Value Date   NA 139 01/06/2019   K 4.1 01/06/2019   CO2 27 01/06/2019   GLUCOSE 80 01/06/2019   BUN 8 01/06/2019   CREATININE 0.52 (L) 01/06/2019   BILITOT 0.3 05/10/2009   ALKPHOS 63 05/10/2009   AST 30 05/10/2009   ALT 42 (H) 05/10/2009   PROT 6.8 05/10/2009   ALBUMIN 4.3 05/10/2009   CALCIUM 9.2 01/06/2019   Lab Results  Component Value Date   CHOL 250 (H) 05/10/2009   Lab Results  Component Value Date   HDL 46 05/10/2009   Lab Results  Component Value Date   LDLCALC 163 (H) 05/10/2009   Lab Results  Component Value Date   TRIG 205 (H) 05/10/2009   Lab Results  Component Value Date   CHOLHDL 5.4 Ratio 05/10/2009   No results found for: HGBA1C    Assessment & Plan:  1. Lower abdominal pain Patient with complaint of continued lower abdominal pain.  Will check urinalysis for possible urinary tract infection.  Lower concern for kidney stones as patient has no significant back pain on exam and denies any issues with hematuria.  She additionally has had recent pelvic ultrasound and a follow-up of her pain and patient with right ovarian cyst as well as uterine fibroid both of which were discussed at today's visit.  She already has upcoming GYN appointment for further  evaluation.  Urinalysis was normal. - POCT URINALYSIS DIP (CLINITEK)  2. Essential hypertension She is currently on amlodipine 5 mg but blood pressure is elevated at today's visit.  Patient also complains of being in pain and I am not sure if her current pain is causing an increase in her blood pressure.  Follow-up appointment will be made with the clinical pharmacist for recheck of blood pressure and possible increase in medication or addition of medication if her blood pressure remains elevated. - Amb Referral to Clinical Pharmacist  3. Right ovarian cyst; 4. uterine fibroid, intramural She is status post transvaginal pelvic ultrasound on 02/14/2019 with findings of a 5.1 cm intramural fibroid at the left uterine body as well as a 3.0 cm complex right ovarian cyst, indeterminate but favored to reflect a benign hemorrhagic cyst.  Keep upcoming GYN appointment for further evaluation and discussion of right ovarian cyst and left sided uterine fibroid.  5. Language barrier Patient with language barrier impeding communication of healthcare information.  She was offered [employer] video interpretation system but declined as she wished to have interpretation done by her daughter at today's visit.   An After Visit Summary was printed and given to the patient.  Follow-up: Return for abdominal pain- keep f/u with GYN; appt with Luke/CPP in 2 weeks in f/u BP.   Antony Blackbird, MD

## 2019-03-17 NOTE — Patient Instructions (Signed)
Quiste ovrico Ovarian Cyst Un quiste ovrico es una bolsa llena de lquido que se forma en el ovario. Los ovarios son rganos de las mujeres que producen vulos. La mayora de los quistes ovricos desaparecen por s solos y no son cancerosos (son benignos). Otros quistes necesitan tratamiento. Siga estas indicaciones en su casa:  Tome los medicamentos de venta libre y los recetados solamente como se lo haya indicado el mdico.  No conduzca ni use maquinaria pesada mientras toma analgsicos recetados.  Realcese exmenes plvicos y pruebas de Papanicolaou con la frecuencia que le haya indicado el mdico.  Retome sus actividades habituales como se lo haya indicado el mdico. Pregntele al mdico qu actividades son seguras para usted.  No consuma ningn producto que contenga nicotina o tabaco, como cigarrillos y cigarrillos electrnicos. Si necesita ayuda para dejar de fumar, consulte al mdico.  Concurra a todas las visitas de control como se lo haya indicado el mdico. Esto es importante. Comunquese con un mdico si:  Los perodos menstruales: ? Se retrasan. ? Son irregulares. ? Son dolorosos.   Sus perodos cesan.  Tiene dolor plvico que no desaparece.  Siente presin en la vejiga.  Tiene dificultad para vaciar la vejiga cuando orina.  Siente dolor durante las relaciones sexuales.  Le aparece alguno de los siguientes sntomas en el abdomen: ? Sensacin de tener el estmago lleno. ? Presin. ? Molestias. ? Dolor que persiste. ? Hinchazn.  Se siente mal constantemente.  Tiene dificultades para defecar (estreimiento).  No tiene el apetito habitual (pierde el apetito).  Tiene un acn muy grave.  Nota un aumento del vello corporal y facial.  Aumenta o disminuye de peso sin hacer modificaciones en su actividad fsica y en su dieta habitual.  Cree que puede estar embarazada. Solicite ayuda de inmediato si:  Siente en el abdomen un dolor muy intenso o que  empeora.  No puede comer ni beber sin vomitar.  Sbitamente tiene fiebre.  Los perodos menstruales son ms abundantes de lo habitual. Esta informacin no tiene como fin reemplazar el consejo del mdico. Asegrese de hacerle al mdico cualquier pregunta que tenga. Document Revised: 05/09/2016 Document Reviewed: 07/07/2015 Elsevier Patient Education  2020 Elsevier Inc.  

## 2019-03-27 ENCOUNTER — Ambulatory Visit (INDEPENDENT_AMBULATORY_CARE_PROVIDER_SITE_OTHER): Payer: Self-pay | Admitting: Family Medicine

## 2019-03-27 ENCOUNTER — Other Ambulatory Visit: Payer: Self-pay

## 2019-03-27 ENCOUNTER — Encounter: Payer: Self-pay | Admitting: Family Medicine

## 2019-03-27 VITALS — BP 141/85 | HR 84 | Wt 159.0 lb

## 2019-03-27 DIAGNOSIS — N83201 Unspecified ovarian cyst, right side: Secondary | ICD-10-CM

## 2019-03-27 DIAGNOSIS — D259 Leiomyoma of uterus, unspecified: Secondary | ICD-10-CM

## 2019-03-27 MED ORDER — NORGESTIMATE-ETH ESTRADIOL 0.25-35 MG-MCG PO TABS: 1.0000 | ORAL_TABLET | Freq: Every day | ORAL | 3 refills | Status: DC

## 2019-03-27 NOTE — Progress Notes (Signed)
   Subjective:    Patient ID: Tracey Gill, female    DOB: [birth year], 39 y.o.   MRN: [id number]  HPI  Patient seen for pain in LLQ x1 year. Pain is intermittent, lasting for days. , worsening over past 6 months. Pain is worse during menses.  Has tried Aleve for the pain, which works well.  Having irregular periods - misses a couple months. Bleeding for 3-5 days, heavy with clots.  Past Gyn Surgeries: none   I have reviewed the patients past medical, family, and social history.  I have reviewed the patient's medication list and allergies.   Review of Systems     Objective:   Physical Exam Vitals reviewed.  Constitutional:      Appearance: She is well-developed.  HENT:     Head: Normocephalic and atraumatic.  Abdominal:     General: Abdomen is flat, scaphoid and protuberant.     Tenderness: There is abdominal tenderness in the right lower quadrant and left lower quadrant. There is no guarding or rebound. Negative signs include Murphy's sign and McBurney's sign.  Neurological:     Mental Status: She is alert.        Assessment & Plan:  1. Uterine leiomyoma, unspecified location Will start on OCPs for menorrhagia, symptom control. She would like a hysterectomy and is in the process of getting insurance. Will schedule for annual exam (needs PAP).  2. Hemorrhagic cyst of right ovary Will get f/u US.  - US PELVIS TRANSVAGINAL NON-OB (TV ONLY); Future

## 2019-03-28 MED FILL — MONO-LINYAH 28 TABLET: 0.25-35 | 28 days supply | Qty: 28 | Fill #0

## 2019-03-31 ENCOUNTER — Ambulatory Visit (HOSPITAL_COMMUNITY)
Admission: RE | Admit: 2019-03-31 | Discharge: 2019-03-31 | Disposition: A | Discharge status: Home / Self Care | Payer: Self-pay | Source: Ambulatory Visit | Attending: Family Medicine | Admitting: Family Medicine

## 2019-03-31 ENCOUNTER — Other Ambulatory Visit: Payer: Self-pay

## 2019-03-31 DIAGNOSIS — N83201 Unspecified ovarian cyst, right side: Secondary | ICD-10-CM | POA: Insufficient documentation

## 2019-04-03 MED FILL — AMLODIPINE BESYLATE 5 MG TA: 5 | 30 days supply | Qty: 30 | Fill #1

## 2019-04-04 ENCOUNTER — Telehealth: Payer: Self-pay

## 2019-04-04 NOTE — Telephone Encounter (Addendum)
-----   Message from Truett Mainland, DO sent at 03/31/2019  3:20 PM EST ----- Regarding: [place] Please let pt know that her cyst resolved.   Called pt with [facility] interpreter [place] [phone]; VM left stating I am calling with results and pt may return call to the office. Callback number given.

## 2019-04-05 NOTE — Telephone Encounter (Signed)
Called patient with Dr.Chelsea Fair, MD and explained that her cyst had resolved and that it looked fine per the ultrasound. Patient stated that she is still having pain and would like to see a provider. Agreed that I would send a message to the front desk to have her scheduled and they will call her with a appointment she verified understanding.

## 2019-04-14 ENCOUNTER — Ambulatory Visit: Payer: Self-pay

## 2019-08-24 MED FILL — AMLODIPINE BESYLATE 5 MG TA: 5 | 30 days supply | Qty: 30 | Fill #2

## 2019-10-27 MED FILL — AMLODIPINE BESYLATE 5 MG TA: 5 | 90 days supply | Qty: 90 | Fill #0

## 2020-09-16 ENCOUNTER — Other Ambulatory Visit: Payer: Self-pay

## 2020-09-16 ENCOUNTER — Ambulatory Visit (INDEPENDENT_AMBULATORY_CARE_PROVIDER_SITE_OTHER): Payer: Self-pay | Admitting: Physician Assistant

## 2020-09-16 ENCOUNTER — Encounter: Payer: Self-pay | Admitting: Physician Assistant

## 2020-09-16 VITALS — BP 136/84 | HR 81 | Temp 98.4°F | Resp 18 | Ht 63.0 in | Wt 153.0 lb

## 2020-09-16 DIAGNOSIS — Z6827 Body mass index (BMI) 27.0-27.9, adult: Secondary | ICD-10-CM

## 2020-09-16 DIAGNOSIS — Z13220 Encounter for screening for lipoid disorders: Secondary | ICD-10-CM

## 2020-09-16 DIAGNOSIS — I10 Essential (primary) hypertension: Secondary | ICD-10-CM

## 2020-09-16 MED ORDER — AMLODIPINE BESYLATE 5 MG PO TABS
5.0000 mg | ORAL_TABLET | Freq: Every day | ORAL | 1 refills | Status: DC
  Filled 2020-09-16: qty 30, 30d supply, fill #0
  Filled 2020-11-19: qty 30, 30d supply, fill #1

## 2020-09-16 NOTE — Patient Instructions (Signed)
Please check your blood pressure at home, keep a written log and have available for your next office visit with me in 1 week.  Please plan on completing your fasting labs at that visit as well.  Kennieth Rad, PA-C Physician Assistant [facility] [email or web address]   Cmo tomarse la presin arterial How to Take Your Blood Pressure La presin arterial es la medida de la fuerza de la sangre al presionar contra las paredes de las arterias. Las arterias son los vasos sanguneos que transportan la sangre desde el corazn hacia todas las partes del cuerpo. Su mdico toma su presin arterial en cada visita al consultorio. Usted tambinpuede tomarse la presin arterial en casa con un tensimetro. Es posible que deba tomarse la presin arterial: Para confirmar un diagnstico de presin arterial elevada (hipertensin). Para vigilar su presin arterial a lo largo del tiempo. Para asegurarse de que el medicamento que toma para la presin arterial est surtiendo Insurance claims handler. Materiales necesarios: Monitor para medir la presin arterial. Silla de comedor para sentarse. Mesa o escritorio. Cuaderno pequeo y lpiz o bolgrafo. Cmo prepararse Para obtener la lectura ms precisa, evite realizar lo siguiente durante los 30 minutos previos a Chief Technology Officer su presin arterial: Beber cafena. Consumir alcohol. Comer. Fumar. Realizar actividad fsica. Cinco minutos antes de controlar su presin arterial: Vaya al bao y orine para tener la vejiga vaca. Sintese tranquilamente en una silla de comedor. No se siente en un silln blando o sof. No hable. Cmo tomarse la presin arterial Para controlar su presin arterial, siga las instrucciones presentes en el manual que se incluye con el tensimetro. Si tiene Best boy, las instrucciones podran ser las siguientes: Sintese derecho en una silla. Coloque los pies en el piso. No cruce los tobillos ni  las piernas. Apoye su brazo izquierdo al nivel de su corazn en una mesa o escritorio, o en el brazo de la silla. Arremnguese. Envuelva la parte superior de su brazo izquierdo, 1 pulgada (2.5 cm) sobre su codo, con el brazalete. Es mejor Production manager brazalete alrededor de la piel Gregory. Ajuste el brazalete alrededor de su brazo. Debe poder meter nicamente un dedo entre el brazalete y Water engineer. Coloque el cordn de modo que quede apoyado en el pliegue del codo. Presione el botn de encendido. Permanezca sentado tranquilamente mientras el brazalete se infla y se desinfla. Lea la lectura digital que aparece en la pantalla del tensimetro y anote los nmeros (Walsh) en un cuaderno. Espere de 2 a 3 minutos, y luego repita los pasos desde el paso 1. Qu significa mi lectura de presin arterial? Una lectura de la presin arterial consta de un nmero ms alto sobre un nmero ms bajo. En condiciones ideales, la presin arterial debe estar por debajo de 120/80. El primer nmero ("superior") es la presin sistlica. Es la medida de la presin de las arterias cuando el corazn late. El segundo nmero ("inferior") es la presin diastlica. Es la medida de la presin en lasarterias cuando el corazn se relaja. La presin arterial se clasifica en cinco etapas. Las siguientes son las etapas para adultos que no tienen enfermedad grave de corto plazo o una afeccin crnica. La presin sistlica y la presin diastlica se miden en una unidad llamada mm Hg (milmetros de mercurio). Normal Presin sistlica: por debajo de 123456. Presin diastlica: por debajo de 80. Elevada Presin sistlica: [id number]. Presin diastlica: por debajo de 80. Etapa 1 de hipertensin Presin sistlica: 0000000. Presin diastlica: [id number]. Etapa 2 de hipertensin Presin  sistlica: [id number] o ms. Presin diastlica: 90 o ms. Puede tener presin arterial elevada o hipertensin incluso si nicamente elnmero sistlico o el  diastlico de su lectura es ms elevado de lo normal. Siga estas instrucciones en su casa: Medicamentos Use los medicamentos de venta libre y los recetados solamente como se lo haya indicado el mdico. Dgale a su mdico si tiene efectos secundarios causados por los medicamentos para la presin arterial. Indicaciones generales Mida su presin arterial con la frecuencia recomendada por su mdico. Contrlese la presin arterial a la [employer]. Lleve el tensimetro a su prxima cita con el mdico para asegurarse de lo siguiente: Que lo [place] correctamente. Que genera lecturas precisas. Asegrese de entender cules son sus nmeros objetivo para la presin arterial. Concurra a todas las visitas de seguimiento como se lo haya indicado el mdico. Esto es importante. Consejos generales Su mdico puede sugerirle un tensimetro confiable que cumpla con sus necesidades. Existen varios tipos de tensimetros para Corporate investment banker. Escoja un tensimetro que tenga un brazalete. No escoja un tensimetro que mida su presin arterial en la mueca o el dedo. Escoja un brazalete que envuelva ceidamente la parte superior de su brazo. Debe poder meter nicamente un dedo entre el brazalete y Water engineer. Puede comprar un tensimetro en la mayora de las farmacias o en lnea. Dnde buscar ms informacin American Heart Association (Asociacin Estadounidense del Corazn): www.heart.org Comunquese con un mdico si: Su presin arterial es sistemticamente alta. Su presin arterial disminuye repentinamente. Solicite ayuda de inmediato si: Su presin arterial sistlica est por encima de 180. Su presin arterial diastlica est por encima de 120. Resumen La presin arterial es la medida de la fuerza de la sangre al presionar contra las paredes de las arterias. Una lectura de la presin arterial consta de un nmero ms alto sobre un nmero ms bajo. En condiciones ideales, la presin arterial debe estar  por debajo de 120/80. Contrlese la presin arterial a la [employer]. Evite la cafena, el alcohol, fumar y hacer actividad fsica durante los 30 minutos anteriores a controlarse la presin arterial. Estos factores pueden afectar la precisin de la lectura de la presin arterial. Esta informacin no tiene Marine scientist el consejo del mdico. Asegresede hacerle al mdico cualquier pregunta que tenga. Document Revised: 01/02/2020 Document Reviewed: 03/13/2019 Elsevier Patient Education  2022 [employer].

## 2020-09-16 NOTE — Progress Notes (Signed)
Established Patient Office Visit  Subjective:  Patient ID: Tracey Gill, female    DOB: 1980/10/20  Age: 40 y.o. MRN: [id number]  CC:  Chief Complaint  Patient presents with   Medication Refill    Blood Pressure    HPI Tracey Gill presents for medication refills and states that she had an elevated BP reading this weekend at [facility] but does not remember the numbers  States that she does not have a cuff at home to check.  States that she has been taking been taking her amlodipine on a daily basis  No other concerns at this time  Due to language barrier, an interpreter was present during the history-taking and subsequent discussion (and for part of the physical exam) with this patient.   Past Medical History:  Diagnosis Date   Hypertension    Known health problems: none     Past Surgical History:  Procedure Laterality Date   removal of benign right breast mass      Family History  Problem Relation Age of Onset   Diabetes Mother    Heart disease Father    Diabetes Sister     Social History   Socioeconomic History   Marital status: Married    Spouse name: Not on file   Number of children: Not on file   Years of education: Not on file   Highest education level: Not on file  Occupational History   Not on file  Tobacco Use   Smoking status: Never   Smokeless tobacco: Never  Substance and Sexual Activity   Alcohol use: No   Drug use: No   Sexual activity: Yes  Other Topics Concern   Not on file  Social History Narrative   Not on file   Social Determinants of Health   Financial Resource Strain: Not on file  Food Insecurity: Not on file  Transportation Needs: Not on file  Physical Activity: Not on file  Stress: Not on file  Social Connections: Not on file  Intimate Partner Violence: Not on file    Outpatient Medications Prior to Visit  Medication Sig Dispense Refill   amLODipine (NORVASC) 5 MG tablet Take 1 tablet (5 mg total) by  mouth daily. To lower blood pressure 90 tablet 1   norgestimate-ethinyl estradiol (ORTHO-CYCLEN) 0.25-35 MG-MCG tablet Take 1 tablet by mouth daily. 3 Package 3   sulfamethoxazole-trimethoprim (BACTRIM DS) 800-160 MG tablet Take 1 tablet by mouth 2 (two) times daily. (Patient not taking: Reported on 03/17/2019) 6 tablet 0   Vitamin D, Ergocalciferol, (DRISDOL) 1.25 MG (50000 UT) CAPS capsule Take 1 capsule (50,000 Units total) by mouth every 7 (seven) days. 5 capsule 3   No facility-administered medications prior to visit.    No Known Allergies  ROS Review of Systems  Constitutional: Negative.   HENT: Negative.    Eyes: Negative.   Respiratory:  Negative for shortness of breath.   Cardiovascular:  Negative for chest pain.  Gastrointestinal: Negative.   Endocrine: Negative.   Genitourinary: Negative.   Musculoskeletal: Negative.   Skin: Negative.   Allergic/Immunologic: Negative.   Neurological: Negative.   Hematological: Negative.   Psychiatric/Behavioral: Negative.       Objective:    Physical Exam Vitals and nursing note reviewed.  Constitutional:      Appearance: Normal appearance.  HENT:     Head: Normocephalic and atraumatic.     Right Ear: External ear normal.     Left Ear: External ear normal.  Nose: Nose normal.     Mouth/Throat:     Mouth: Mucous membranes are moist.     Pharynx: Oropharynx is clear.  Eyes:     Extraocular Movements: Extraocular movements intact.     Conjunctiva/sclera: Conjunctivae normal.     Pupils: Pupils are equal, round, and reactive to light.  Cardiovascular:     Rate and Rhythm: Normal rate and regular rhythm.     Pulses: Normal pulses.     Heart sounds: Normal heart sounds.  Pulmonary:     Effort: Pulmonary effort is normal.     Breath sounds: Normal breath sounds.  Musculoskeletal:        General: Normal range of motion.     Cervical back: Normal range of motion and neck supple.  Skin:    General: Skin is warm and dry.   Neurological:     General: No focal deficit present.     Mental Status: She is alert and oriented to person, place, and time.  Psychiatric:        Mood and Affect: Mood normal.        Behavior: Behavior normal.        Thought Content: Thought content normal.        Judgment: Judgment normal.    BP 136/84 (BP Location: Right Arm, Patient Position: Sitting, Cuff Size: Normal)   Pulse 81   Temp 98.4 F (36.9 C) (Temporal)   Resp 18   Ht '5\' 3"'$  (1.6 m)   Wt 153 lb (69.4 kg)   LMP 09/16/2020   SpO2 98%   BMI 27.10 kg/m  Wt Readings from Last 3 Encounters:  09/16/20 153 lb (69.4 kg)  03/27/19 159 lb (72.1 kg)  03/17/19 161 lb (73 kg)     Health Maintenance Due  Topic Date Due   COVID-19 Vaccine (1) Never done   HIV Screening  Never done   Hepatitis C Screening  Never done   TETANUS/TDAP  Never done   PAP SMEAR-Modifier  01/28/2014   INFLUENZA VACCINE  09/16/2020    There are no preventive care reminders to display for this patient.  Lab Results  Component Value Date   TSH 1.570 01/06/2019   Lab Results  Component Value Date   WBC 7.4 01/06/2019   HGB 12.6 01/06/2019   HCT 36.9 01/06/2019   MCV 87 01/06/2019   PLT 343 01/06/2019   Lab Results  Component Value Date   NA 139 01/06/2019   K 4.1 01/06/2019   CO2 27 01/06/2019   GLUCOSE 80 01/06/2019   BUN 8 01/06/2019   CREATININE 0.52 (L) 01/06/2019   BILITOT 0.3 05/10/2009   ALKPHOS 63 05/10/2009   AST 30 05/10/2009   ALT 42 (H) 05/10/2009   PROT 6.8 05/10/2009   ALBUMIN 4.3 05/10/2009   CALCIUM 9.2 01/06/2019   Lab Results  Component Value Date   CHOL 250 (H) 05/10/2009   Lab Results  Component Value Date   HDL 46 05/10/2009   Lab Results  Component Value Date   LDLCALC 163 (H) 05/10/2009   Lab Results  Component Value Date   TRIG 205 (H) 05/10/2009   Lab Results  Component Value Date   CHOLHDL 5.4 Ratio 05/10/2009   No results found for: HGBA1C    Assessment & Plan:   Problem List  Items Addressed This Visit       Cardiovascular and Mediastinum   Essential hypertension - Primary   Relevant Medications   amLODipine (NORVASC)  5 MG tablet   Other Relevant Orders   CBC with Differential/Platelet   Comp. Metabolic Panel (12)   TSH   Other Visit Diagnoses     Screening, lipid       Relevant Orders   Lipid panel   BMI 27.0-27.9,adult           Meds ordered this encounter  Medications   amLODipine (NORVASC) 5 MG tablet    Sig: Take 1 tablet (5 mg total) by mouth daily. To lower blood pressure    Dispense:  30 tablet    Refill:  1    Order Specific Question:   Supervising Provider    Answer:   WRIGHT, PATRICK E [[id number]]   1. Essential hypertension Continue current regimen, patient encouraged to check blood pressure at home on a daily basis, keep a written log and have available for all office visits.  Patient to follow-up with this provider in 1 week with blood pressure readings, and to have fasting labs completed.  Patient understands and agrees.  Red flags given for prompt reevaluation. - CBC with Differential/Platelet; Future - Comp. Metabolic Panel (12); Future - TSH; Future - amLODipine (NORVASC) 5 MG tablet; Take 1 tablet (5 mg total) by mouth daily. To lower blood pressure  Dispense: 30 tablet; Refill: 1  2. Screening, lipid  - Lipid panel; Future   I have reviewed the patient's medical history (PMH, PSH, Social History, Family History, Medications, and allergies) , and have been updated if relevant. I spent 31 minutes reviewing chart and  face to face time with patient.    Follow-up: Return in about 3 months (around 12/17/2020).    Loraine Grip Mayers, PA-C

## 2020-09-16 NOTE — Progress Notes (Signed)
Patient took BP medication today and has eaten today. Patient denies pain at this time. Patient reports elevated BP with HA this past Saturday while running errands. Patient shares she was not staying hydrated while out. Patient denies any HA, dizziness or nausea at this time.

## 2020-09-18 ENCOUNTER — Other Ambulatory Visit: Payer: Self-pay

## 2020-09-23 ENCOUNTER — Other Ambulatory Visit: Payer: Self-pay

## 2020-09-30 ENCOUNTER — Other Ambulatory Visit: Payer: Self-pay

## 2020-09-30 DIAGNOSIS — E7800 Pure hypercholesterolemia, unspecified: Secondary | ICD-10-CM

## 2020-09-30 DIAGNOSIS — I10 Essential (primary) hypertension: Secondary | ICD-10-CM

## 2020-09-30 DIAGNOSIS — D649 Anemia, unspecified: Secondary | ICD-10-CM

## 2020-09-30 DIAGNOSIS — Z13220 Encounter for screening for lipoid disorders: Secondary | ICD-10-CM

## 2020-10-01 ENCOUNTER — Other Ambulatory Visit: Payer: Self-pay

## 2020-10-01 DIAGNOSIS — E7800 Pure hypercholesterolemia, unspecified: Secondary | ICD-10-CM | POA: Insufficient documentation

## 2020-10-01 DIAGNOSIS — D649 Anemia, unspecified: Secondary | ICD-10-CM | POA: Insufficient documentation

## 2020-10-01 LAB — LIPID PANEL
Chol/HDL Ratio: 3.8 ratio (ref 0.0–4.4)
Cholesterol, Total: 198 mg/dL (ref 100–199)
HDL: 52 mg/dL (ref >39)
LDL Chol Calc (NIH): 120 mg/dL — ABNORMAL HIGH (ref 0–99)
Triglycerides: 146 mg/dL (ref 0–149)
VLDL Cholesterol Cal: 26 mg/dL (ref 5–40)

## 2020-10-01 LAB — COMP. METABOLIC PANEL (12)
AST: 18 IU/L (ref 0–40)
Albumin/Globulin Ratio: 1.9 (ref 1.2–2.2)
Albumin: 3.9 g/dL (ref 3.8–4.8)
Alkaline Phosphatase: 70 IU/L (ref 44–121)
BUN/Creatinine Ratio: 18 (ref 9–23)
BUN: 9 mg/dL (ref 6–20)
Bilirubin Total: 0.2 mg/dL (ref 0.0–1.2)
Calcium: 8.4 mg/dL — ABNORMAL LOW (ref 8.7–10.2)
Chloride: 104 mmol/L (ref 96–106)
Creatinine, Ser: 0.5 mg/dL — ABNORMAL LOW (ref 0.57–1.00)
Globulin, Total: 2.1 g/dL (ref 1.5–4.5)
Glucose: 86 mg/dL (ref 65–99)
Potassium: 3.8 mmol/L (ref 3.5–5.2)
Sodium: 139 mmol/L (ref 134–144)
Total Protein: 6 g/dL (ref 6.0–8.5)
eGFR: 123 mL/min/{1.73_m2} (ref >59)

## 2020-10-01 LAB — TSH: TSH: 1.62 u[IU]/mL (ref 0.450–4.500)

## 2020-10-01 LAB — CBC WITH DIFFERENTIAL/PLATELET
Basophils Absolute: 0 10*3/uL (ref 0.0–0.2)
Basos: 1 %
EOS (ABSOLUTE): 0.2 10*3/uL (ref 0.0–0.4)
Eos: 4 %
Hematocrit: 31.3 % — ABNORMAL LOW (ref 34.0–46.6)
Hemoglobin: 8.9 g/dL — ABNORMAL LOW (ref 11.1–15.9)
Immature Grans (Abs): 0 10*3/uL (ref 0.0–0.1)
Immature Granulocytes: 0 %
Lymphocytes Absolute: 2.2 10*3/uL (ref 0.7–3.1)
Lymphs: 45 %
MCH: 19.8 pg — ABNORMAL LOW (ref 26.6–33.0)
MCHC: 28.4 g/dL — ABNORMAL LOW (ref 31.5–35.7)
MCV: 70 fL — ABNORMAL LOW (ref 79–97)
Monocytes Absolute: 0.4 10*3/uL (ref 0.1–0.9)
Monocytes: 7 %
Neutrophils Absolute: 2.2 10*3/uL (ref 1.4–7.0)
Neutrophils: 43 %
Platelets: 418 10*3/uL (ref 150–450)
RBC: 4.5 x10E6/uL (ref 3.77–5.28)
RDW: 18.8 % — ABNORMAL HIGH (ref 11.7–15.4)
WBC: 5 10*3/uL (ref 3.4–10.8)

## 2020-10-01 MED ORDER — IRON (FERROUS SULFATE) 325 (65 FE) MG PO TABS: 325.0000 mg | ORAL_TABLET | Freq: Every day | ORAL | 1 refills | Status: DC

## 2020-10-01 MED ORDER — IRON (FERROUS SULFATE) 325 (65 FE) MG PO TABS
325.0000 mg | ORAL_TABLET | Freq: Every day | ORAL | 1 refills | Status: DC
  Filled 2020-10-01: qty 60, 30d supply, fill #0

## 2020-10-07 ENCOUNTER — Telehealth: Payer: Self-pay | Admitting: *Deleted

## 2020-10-07 IMAGING — US US PELVIS COMPLETE WITH TRANSVAGINAL
1 series · 13 of 25 positions shown · non-contrast
Comparison: None available.

CLINICAL DATA: Initial evaluation for left lower quadrant pain.
History of ovarian cyst.



[Series 1: us pelvis complete with transvaginal · 89 acquisitions, 13 frames shown]
[im 1/89]
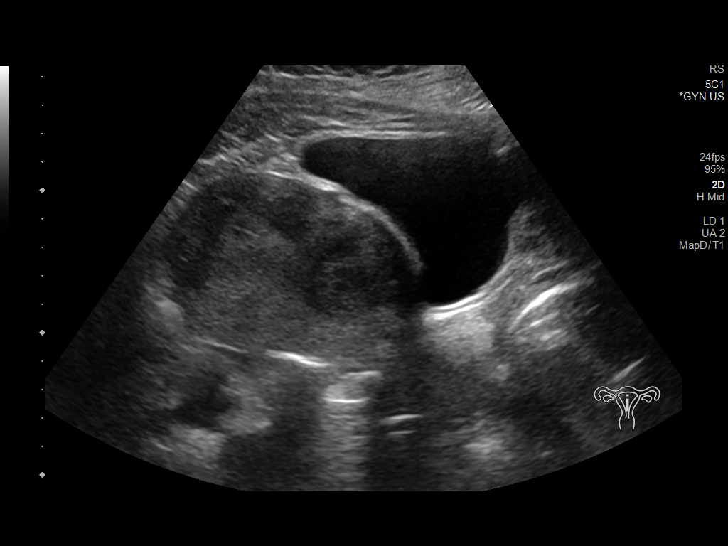
[im 8/89]
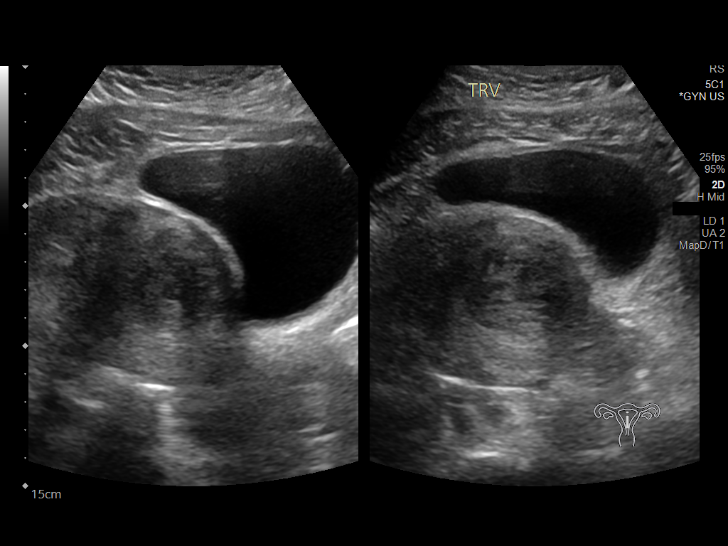
[im 15/89]
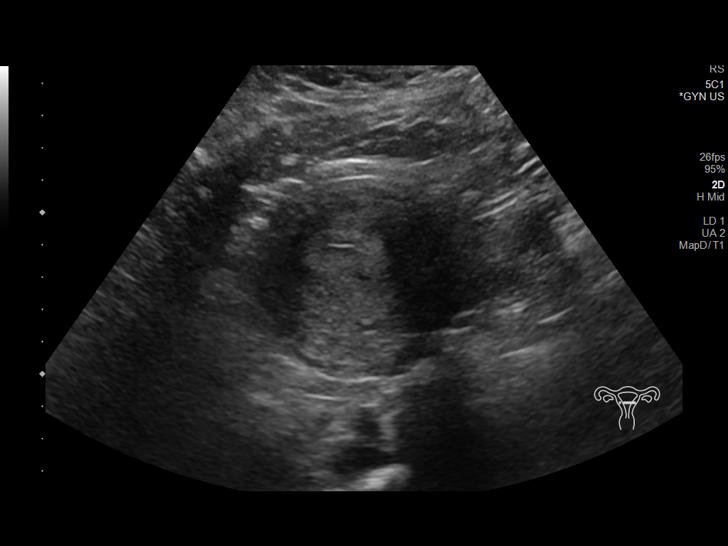
[im 23/89]
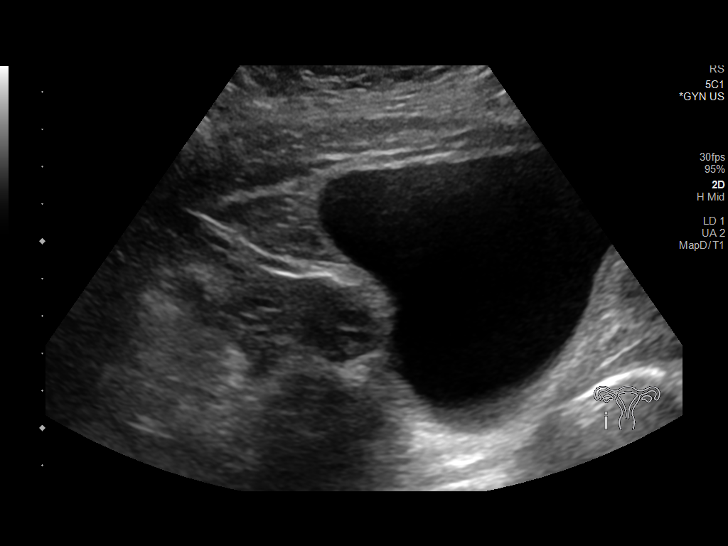
[im 30/89]
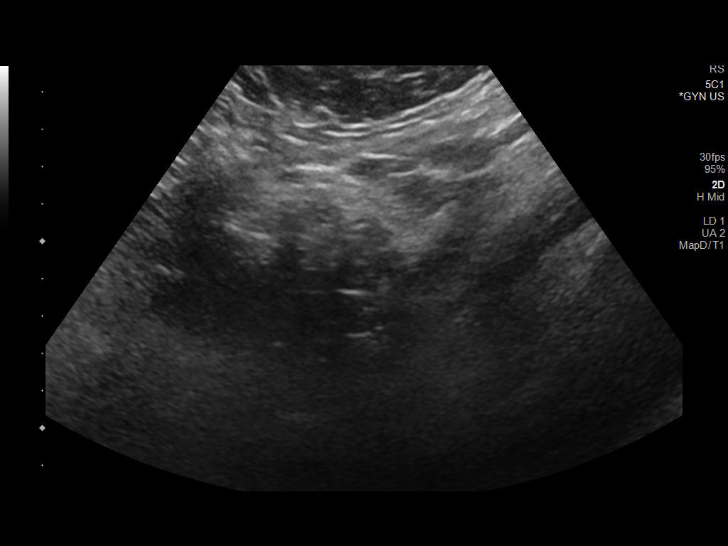
[im 37/89]
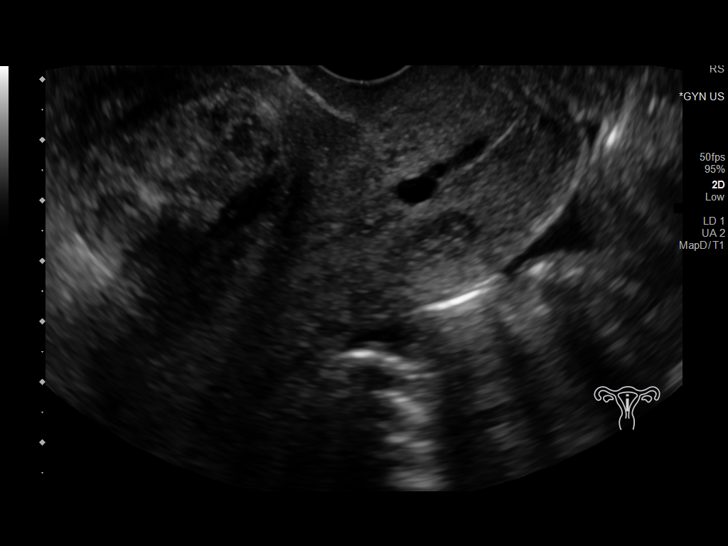
[im 45/89]
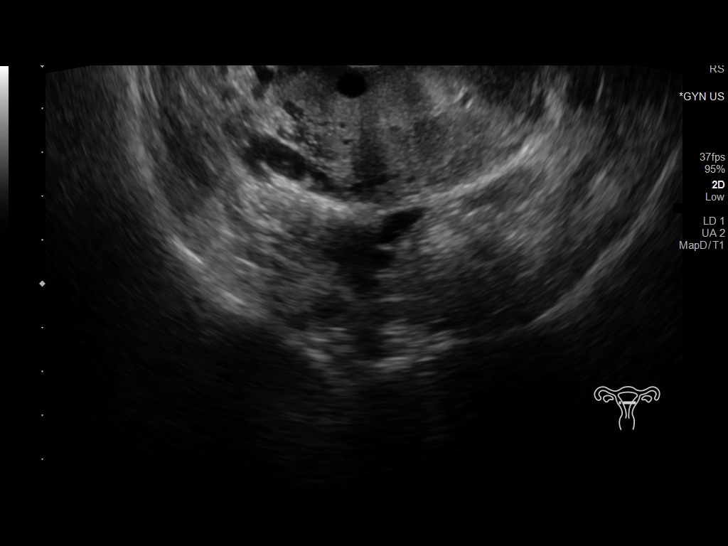
[im 52/89]
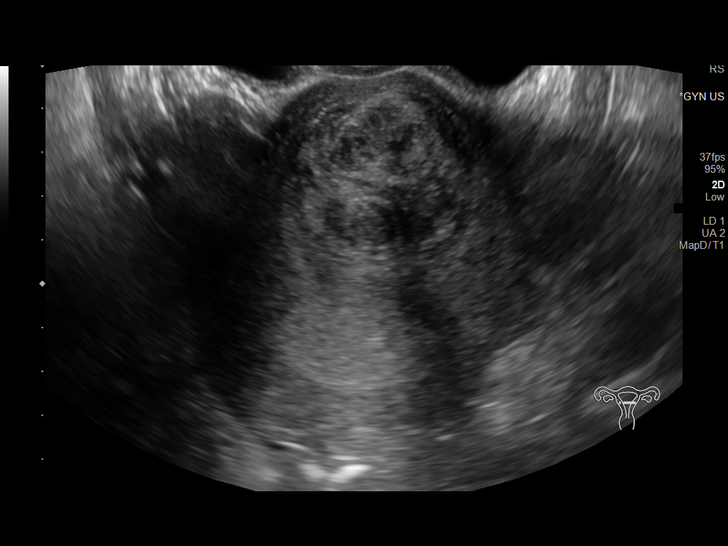
[im 59/89]
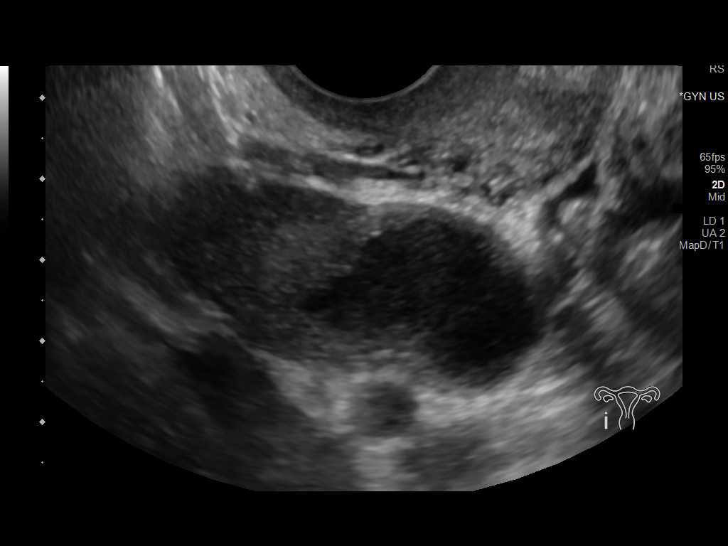
[im 67/89]
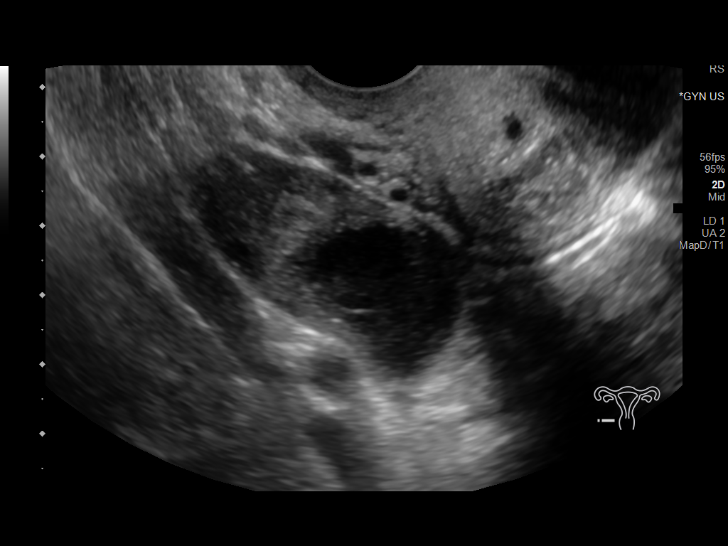
[im 74/89]
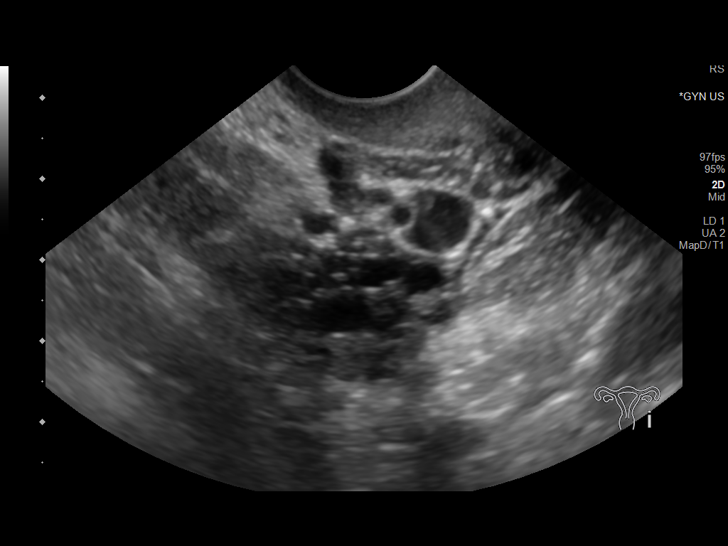
[im 81/89]
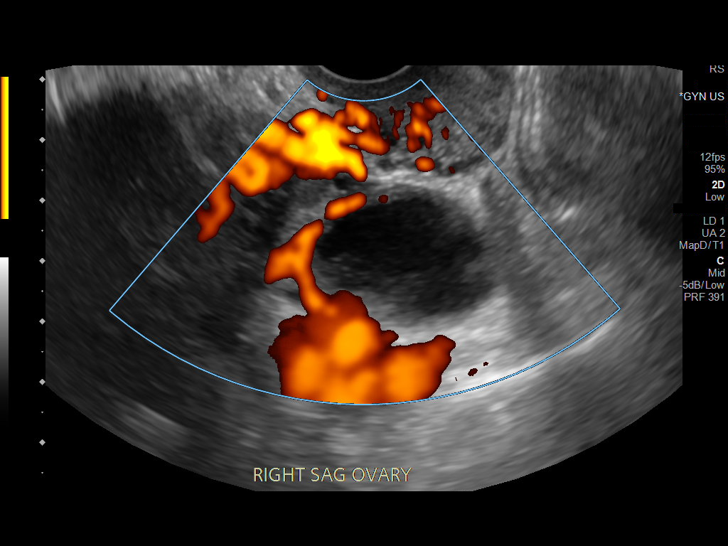
[im 89/89]
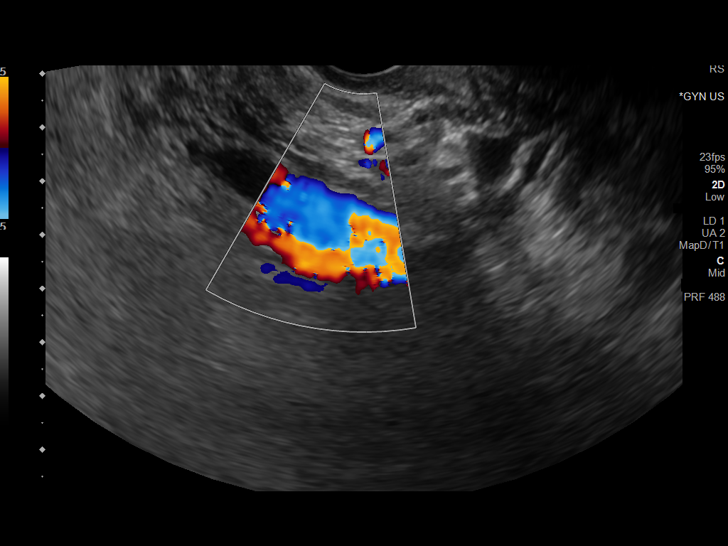

[13 of 25 positions shown; findings below may reference images not displayed]

FINDINGS: Uterus

Measurements: 12.8 x 6.5 x 8.5 cm = volume: 365.7 mL. 4.7 x 5.1 x
4.7 cm intramural fibroid present at the left uterine body.

Endometrium

Thickness: 10.0 mm.  No focal abnormality visualized.

Right ovary

Measurements: 4.9 x 2.2 x 3.5 cm = volume: 19.4 mL. 3.0 x 2.3 x
cm complex hypoechoic cystic lesion. Scattered areas of internal
lace-like architecture. No solid component or internal vascularity.

Left ovary

Measurements: 2.9 x 1.5 x 1.7 cm = volume: 3.0 mL. Normal
appearance/no adnexal mass.

Other findings

No abnormal free fluid.
IMPRESSION: 1. 5.1 cm intramural fibroid at the left uterine body. Finding could
potentially contribute to patient's symptoms of left lower quadrant
pain.
2. 3.0 cm complex right ovarian cyst, indeterminate, but favored to
reflect a benign hemorrhagic cyst. Short interval follow-up
ultrasound in 6-12 weeks to ensure resolution is suggested as
clinically warranted.
3. Normal left ovary. No left adnexal mass or other acute
abnormality.

## 2020-10-07 NOTE — Telephone Encounter (Signed)
Medical Assistant used [employer] to contact patient.  Interpreter Name: Venetia Night Interpreter #: [id number] Patient verified DOB Patient is aware of labs showing low iron and needing to address cholesterol with limiting cholesterol intake as well as taking 325 mgm of iron with 1 tablet for 3 days and if tolerated take 2 tablets. All other labs are normal and patient needs to FU with PCP or [facility] for BP check and iron level recheck.

## 2020-10-07 NOTE — Telephone Encounter (Signed)
-----   Message from Kennieth Rad, [place] sent at 10/01/2020 12:18 PM EDT ----- Please call patient and let her know that she does show signs of anemia, she needs to start taking a low dose of ferrous sulfate 325 mgm once daily, and if tolerated, increase dose to 2 tabs daily.  Script sent to her pharmacy. Her thyroid function, kidney function and liver function are within normal limits, she does show signs of dehydration.  Her cholesterol overall is within normal limits, however her "bad" cholesterol is elevated.  It is important that she follow a low-cholesterol diet and have this rechecked in approximately 6 months to 1 year.  I strongly encouraged her to follow-up with the mobile unit as previously advised, (she was seen at [facility] by our team) so we can recheck her blood pressure, she will also need follow-up labs completed for anemia.

## 2020-10-08 ENCOUNTER — Other Ambulatory Visit: Payer: Self-pay

## 2020-10-15 ENCOUNTER — Other Ambulatory Visit: Payer: Self-pay

## 2020-11-06 ENCOUNTER — Ambulatory Visit: Payer: Self-pay | Admitting: Nurse Practitioner

## 2020-11-19 ENCOUNTER — Other Ambulatory Visit: Payer: Self-pay

## 2020-11-21 IMAGING — US US TRANSVAGINAL NON-OB
1 series · 15 of 25 positions shown · non-contrast
Comparison: Prior ultrasound from 02/14/2019

CLINICAL DATA: Follow-up examination for ovarian cyst.

EXAM:
ULTRASOUND PELVIS TRANSVAGINAL
TECHNIQUE: Transvaginal ultrasound examination of the pelvis was performed
including evaluation of the uterus, ovaries, adnexal regions, and
pelvic cul-de-sac.

[Series 1: us transvaginal non-ob · 15 of 32 slices shown]
[im 1/32]
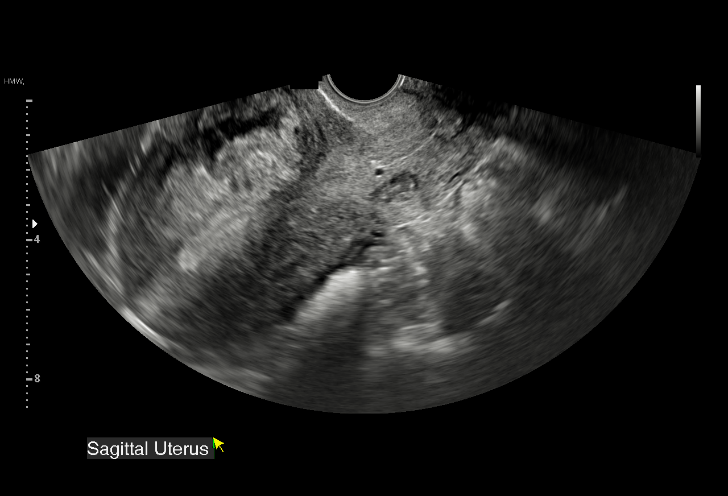
[im 3/32]
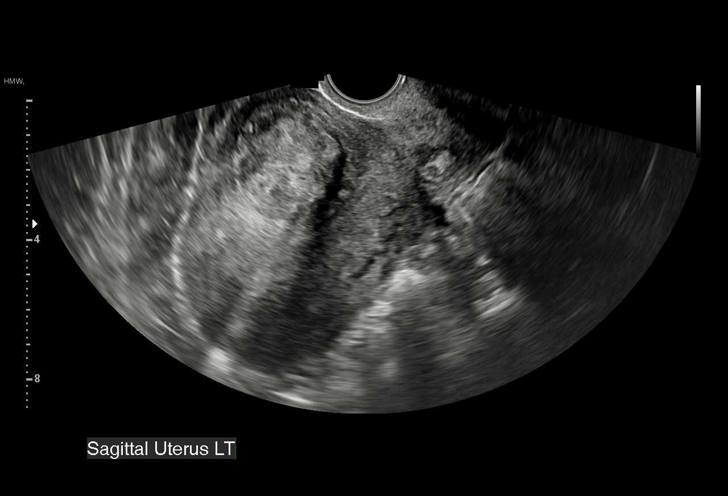
[im 6/32]
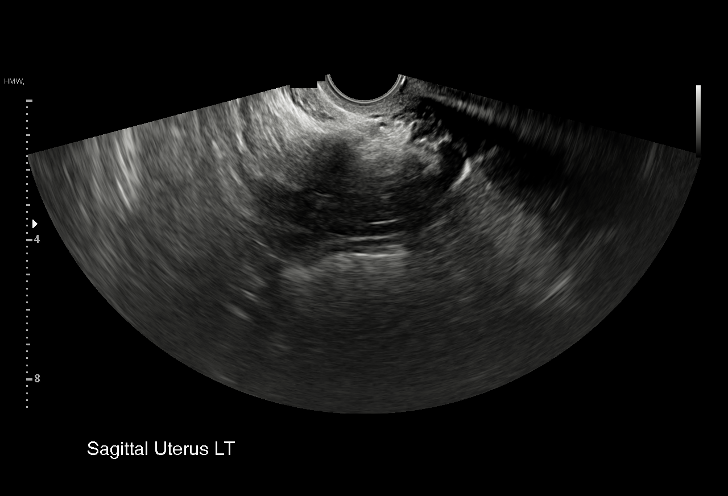
[im 7/32]
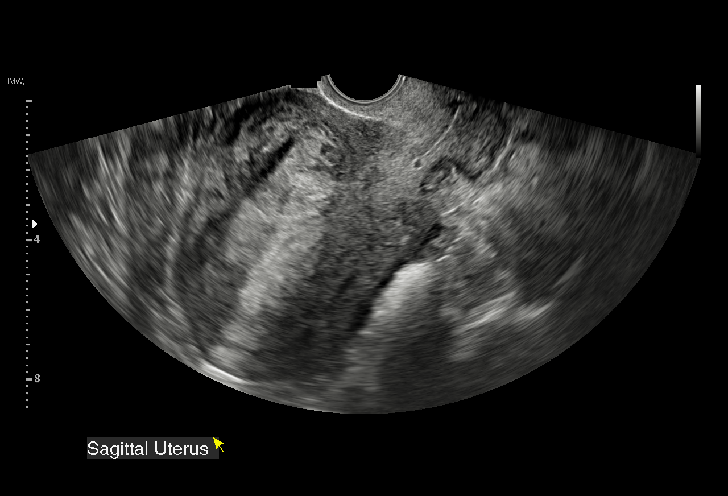
[im 10/32]
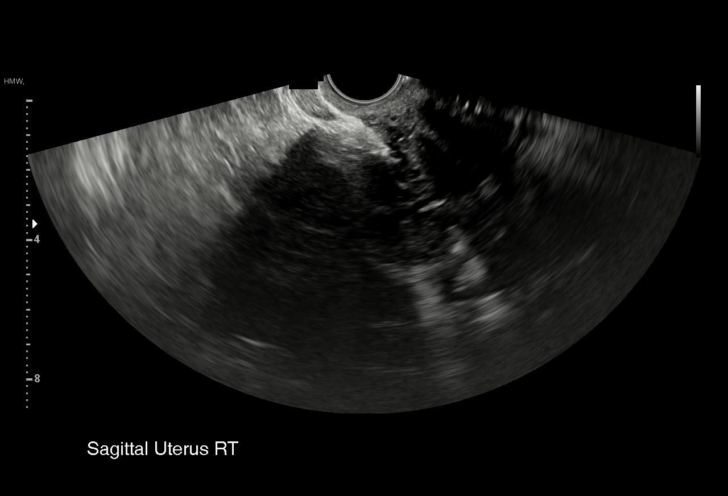
[im 12/32]
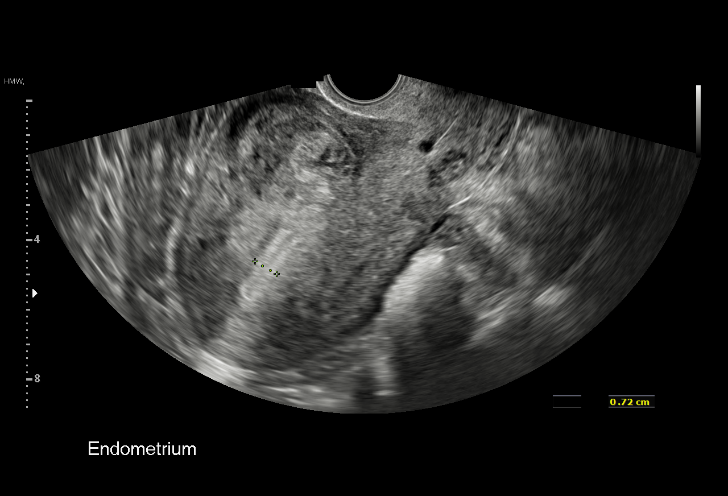
[im 13/32]
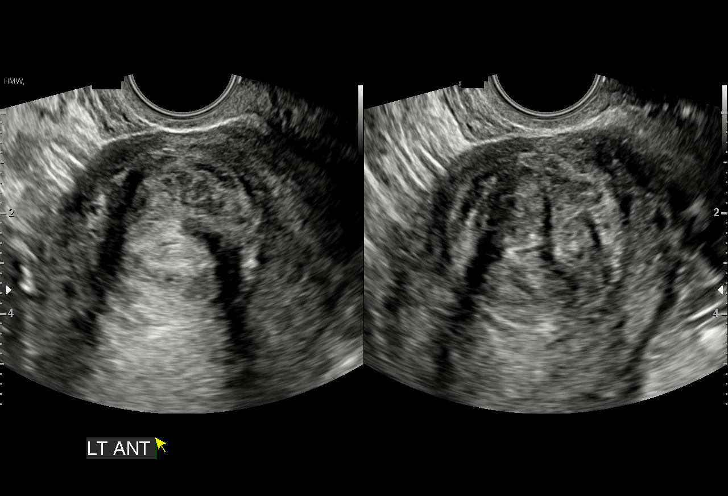
[im 16/32]
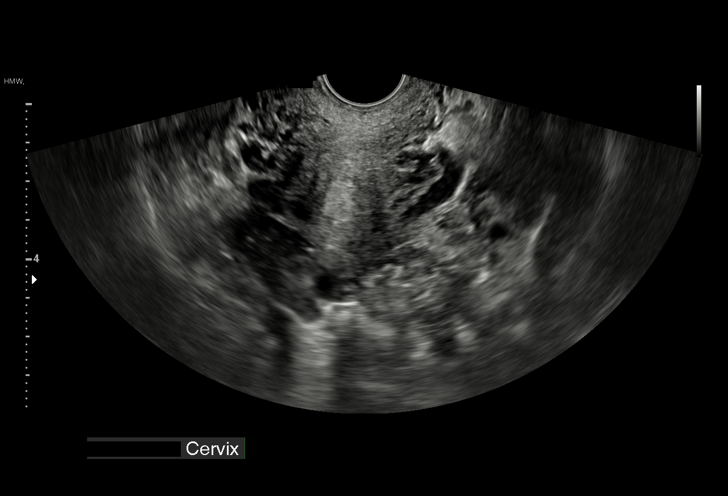
[im 19/32]
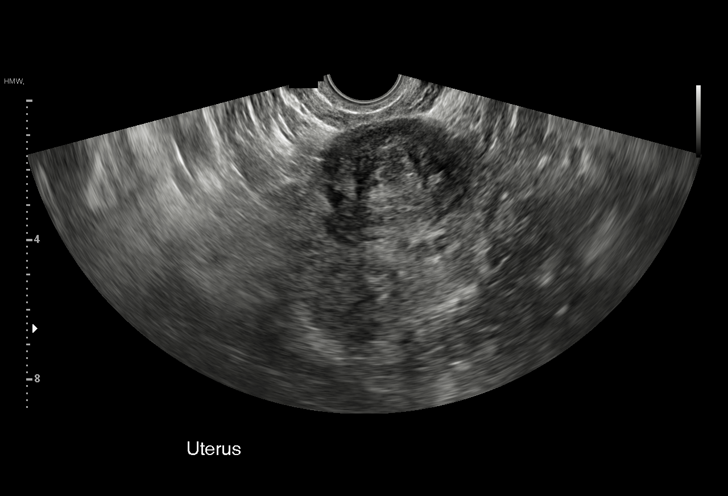
[im 20/32]
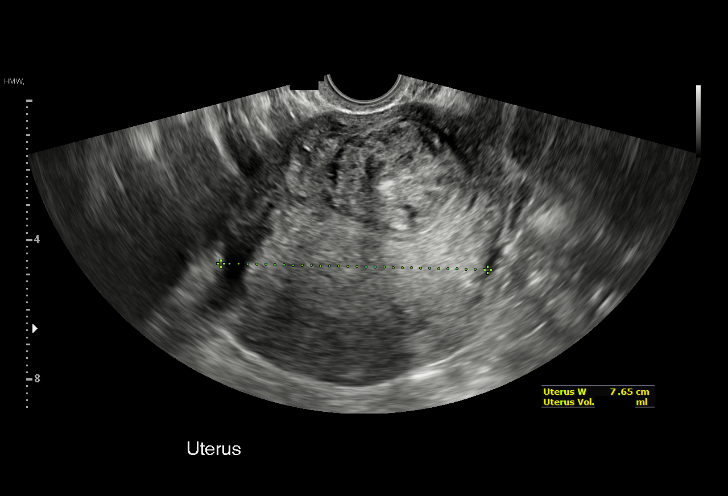
[im 22/32]
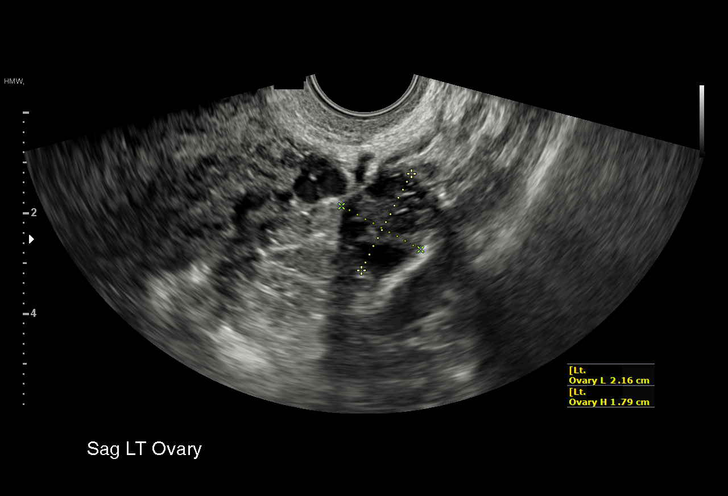
[im 25/32]
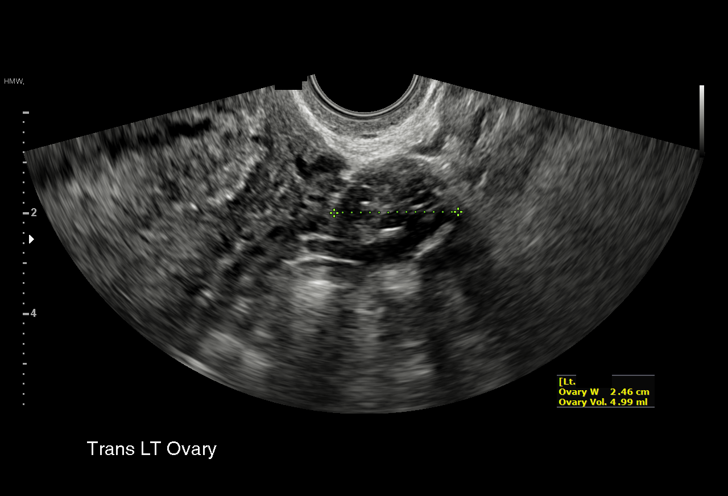
[im 26/32]
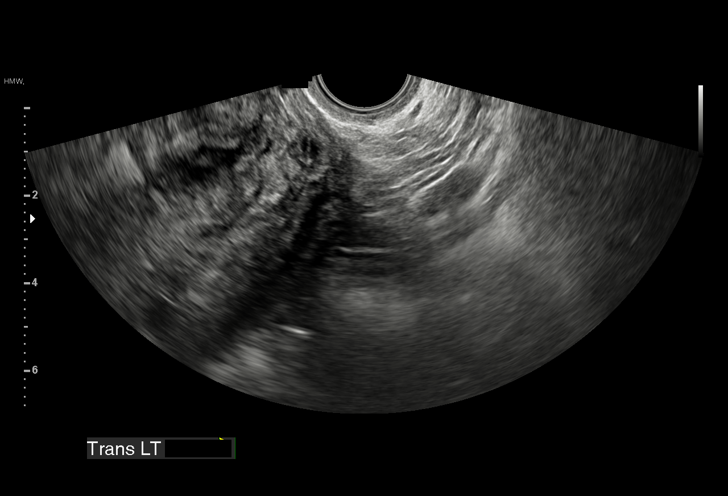
[im 29/32]
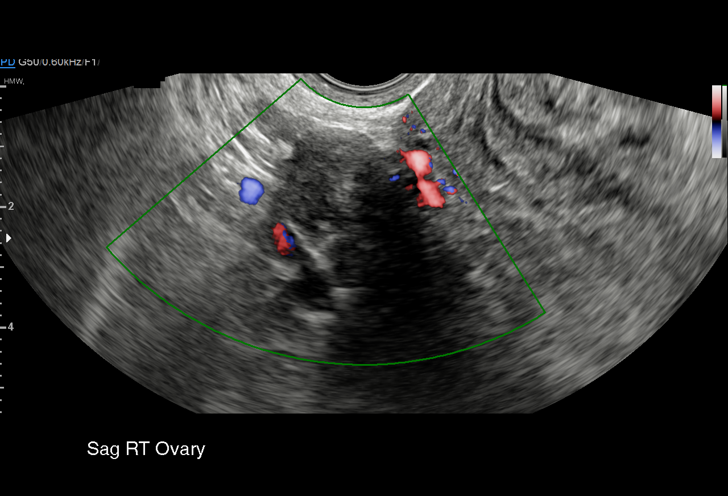
[im 32/32]
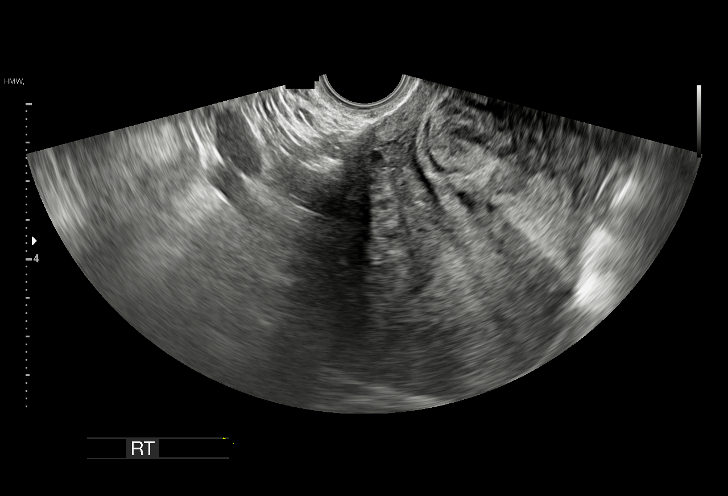

[15 of 25 positions shown; findings below may reference images not displayed]

FINDINGS: Uterus

Measurements: 12.3 x 6.7 x 7.7 cm = volume: 327.1 mL. Uterus is
anteflexed. 4.3 x 3.8 x 3.7 cm intramural fibroid again noted at the
left uterine body.

Endometrium

Thickness: 7.2 mm.  No focal abnormality visualized.

Right ovary

Measurements: 2.6 x 2.1 x 2.0 cm = volume: 5.5 mL. Normal
appearance/no adnexal mass. Previously seen complex right ovarian
cyst has resolved.

Left ovary

Measurements: 2.2 x 1.8 x 2.5 cm = volume: 5.0 mL. Normal
appearance/no adnexal mass.

Other findings

No abnormal free fluid.
IMPRESSION: 1. Interval resolution of previously seen complex right ovarian
cyst, consistent with a resolved hemorrhagic cyst. Ovaries are
normal in appearance on today's exam. No new adnexal mass or other
acute abnormality.
2. Stable intramural fibroid at the left uterine body.

## 2020-12-22 ENCOUNTER — Emergency Department (HOSPITAL_COMMUNITY): Payer: Self-pay

## 2020-12-22 ENCOUNTER — Emergency Department (HOSPITAL_COMMUNITY)
Admission: EM | Admit: 2020-12-22 | Discharge: 2020-12-22 | Disposition: A | Discharge status: Home / Self Care | Payer: Self-pay | Attending: Emergency Medicine | Admitting: Emergency Medicine

## 2020-12-22 DIAGNOSIS — R112 Nausea with vomiting, unspecified: Secondary | ICD-10-CM | POA: Insufficient documentation

## 2020-12-22 DIAGNOSIS — N9489 Other specified conditions associated with female genital organs and menstrual cycle: Secondary | ICD-10-CM | POA: Insufficient documentation

## 2020-12-22 DIAGNOSIS — R109 Unspecified abdominal pain: Secondary | ICD-10-CM | POA: Insufficient documentation

## 2020-12-22 DIAGNOSIS — I10 Essential (primary) hypertension: Secondary | ICD-10-CM | POA: Insufficient documentation

## 2020-12-22 DIAGNOSIS — Z79899 Other long term (current) drug therapy: Secondary | ICD-10-CM | POA: Insufficient documentation

## 2020-12-22 LAB — CBC
HCT: 29.2 % — ABNORMAL LOW (ref 36.0–46.0)
Hemoglobin: 8.8 g/dL — ABNORMAL LOW (ref 12.0–15.0)
MCH: 20.5 pg — ABNORMAL LOW (ref 26.0–34.0)
MCHC: 30.1 g/dL (ref 30.0–36.0)
MCV: 67.9 fL — ABNORMAL LOW (ref 80.0–100.0)
Platelets: 431 10*3/uL — ABNORMAL HIGH (ref 150–400)
RBC: 4.3 MIL/uL (ref 3.87–5.11)
RDW: 19.6 % — ABNORMAL HIGH (ref 11.5–15.5)
WBC: 6.8 10*3/uL (ref 4.0–10.5)
nRBC: 0 % (ref 0.0–0.2)

## 2020-12-22 LAB — I-STAT BETA HCG BLOOD, ED (MC, WL, AP ONLY): I-stat hCG, quantitative: <5 m[IU]/mL (ref <5)

## 2020-12-22 LAB — LIPASE, BLOOD: Lipase: 47 U/L (ref 11–51)

## 2020-12-22 LAB — COMPREHENSIVE METABOLIC PANEL
ALT: 13 U/L (ref 0–44)
AST: 17 U/L (ref 15–41)
Albumin: 4 g/dL (ref 3.5–5.0)
Alkaline Phosphatase: 54 U/L (ref 38–126)
Anion gap: 7 (ref 5–15)
BUN: 12 mg/dL (ref 6–20)
CO2: 21 mmol/L — ABNORMAL LOW (ref 22–32)
Calcium: 8.5 mg/dL — ABNORMAL LOW (ref 8.9–10.3)
Chloride: 108 mmol/L (ref 98–111)
Creatinine, Ser: 0.48 mg/dL (ref 0.44–1.00)
GFR, Estimated: >60 mL/min (ref >60)
Glucose, Bld: 90 mg/dL (ref 70–99)
Potassium: 3.4 mmol/L — ABNORMAL LOW (ref 3.5–5.1)
Sodium: 136 mmol/L (ref 135–145)
Total Bilirubin: 0.4 mg/dL (ref 0.3–1.2)
Total Protein: 6.9 g/dL (ref 6.5–8.1)

## 2020-12-22 LAB — URINALYSIS, ROUTINE W REFLEX MICROSCOPIC
Bilirubin Urine: NEGATIVE
Glucose, UA: NEGATIVE mg/dL
Hgb urine dipstick: NEGATIVE
Ketones, ur: NEGATIVE mg/dL
Leukocytes,Ua: NEGATIVE
Nitrite: NEGATIVE
Protein, ur: NEGATIVE mg/dL
Specific Gravity, Urine: 1.017 (ref 1.005–1.030)
pH: 8 (ref 5.0–8.0)

## 2020-12-22 MED ORDER — NAPROXEN 375 MG PO TABS: 375.0000 mg | ORAL_TABLET | Freq: Two times a day (BID) | ORAL | 0 refills | Status: DC

## 2020-12-22 MED ORDER — HYDROCODONE-ACETAMINOPHEN 5-325 MG PO TABS
1.0000 | ORAL_TABLET | Freq: Once | ORAL | Status: AC
  Administered 2020-12-22: 1 via ORAL
  Filled 2020-12-22: qty 1

## 2020-12-22 MED ORDER — ONDANSETRON 4 MG PO TBDP
4.0000 mg | ORAL_TABLET | Freq: Once | ORAL | Status: AC | PRN
  Administered 2020-12-22: 4 mg via ORAL
  Filled 2020-12-22: qty 1

## 2020-12-22 NOTE — ED Provider Notes (Signed)
[facility] DEPT Provider Note   CSN: [id number] Arrival date & time: 12/22/20  1006     History Chief Complaint  Patient presents with   Abdominal Pain    Tracey Gill is a 40 y.o. female.  HPI  40 year old female with past medical history of HTN presents the emergency department with concern for left-sided flank pain.  Patient is primarily Spanish-speaking, prefers to use her friend at bedside for translation, declining interpreter services.  Patient states that the pain started earlier this morning around 9 AM.  Seems to be positional and worse with certain movements.  She did have mild nausea and an episode of vomiting.  Denies any acute GU symptoms.  No diarrhea.  No fever.  Believes she may have been told that she has kidney stones in the past.  Denies any rash or tingling along the painful area.  Past Medical History:  Diagnosis Date   Hypertension    Known health problems: none     Patient Active Problem List   Diagnosis Date Noted   Elevated LDL cholesterol level 10/01/2020   Anemia 10/01/2020   Essential hypertension 09/16/2020   BMI 27.0-27.9,adult 09/16/2020    Past Surgical History:  Procedure Laterality Date   removal of benign right breast mass       OB History   No obstetric history on file.     Family History  Problem Relation Age of Onset   Diabetes Mother    Heart disease Father    Diabetes Sister     Social History   Tobacco Use   Smoking status: Never   Smokeless tobacco: Never  Substance Use Topics   Alcohol use: No   Drug use: No    Home Medications Prior to Admission medications   Medication Sig Start Date End Date Taking? Authorizing Provider  naproxen (NAPROSYN) 375 MG tablet Take 1 tablet (375 mg total) by mouth 2 (two) times daily. 12/22/20  Yes Peri Kreft, Alvin Critchley, DO  amLODipine (NORVASC) 5 MG tablet Take 1 tablet (5 mg total) by mouth daily. To lower blood pressure 09/16/20   Mayers,  Cari S, PA-C  Iron, Ferrous Sulfate, 325 (65 Fe) MG TABS Take 325 mg by mouth daily. Increase to BID after one week if tolerated 10/01/20   Mayers, Cari S, PA-C    Allergies    Patient has no known allergies.  Review of Systems   Review of Systems  Constitutional:  Negative for fever.  HENT:  Negative for congestion.   Respiratory:  Negative for shortness of breath.   Cardiovascular:  Negative for chest pain.  Gastrointestinal:  Positive for nausea and vomiting. Negative for abdominal pain, blood in stool and diarrhea.  Genitourinary:  Positive for flank pain. Negative for dysuria, hematuria and vaginal bleeding.  Musculoskeletal:  Negative for back pain.  Skin:  Negative for rash.  Neurological:  Negative for headaches.   Physical Exam Updated Vital Signs BP 107/65   Pulse 61   Temp 99.7 F (37.6 C) (Oral)   Resp 16   Ht 5\' 2"  (1.575 m)   Wt 70.3 kg   SpO2 99%   BMI 28.35 kg/m   Physical Exam Vitals and nursing note reviewed.  Constitutional:      Appearance: Normal appearance.  HENT:     Head: Normocephalic.     Mouth/Throat:     Mouth: Mucous membranes are moist.  Cardiovascular:     Rate and Rhythm: Normal rate.  Pulmonary:  Effort: Pulmonary effort is normal. No respiratory distress.  Abdominal:     Palpations: Abdomen is soft.     Tenderness: There is no abdominal tenderness. There is left CVA tenderness.     Comments: Reproducible tenderness to palpation along the left flank, no noted rash, no abdominal pain  Skin:    General: Skin is warm.  Neurological:     Mental Status: She is alert and oriented to person, place, and time. Mental status is at baseline.  Psychiatric:        Mood and Affect: Mood normal.    ED Results / Procedures / Treatments   Labs (all labs ordered are listed, but only abnormal results are displayed) Labs Reviewed  URINALYSIS, ROUTINE W REFLEX MICROSCOPIC - Abnormal; Notable for the following components:      Result Value    APPearance CLOUDY (*)    All other components within normal limits  COMPREHENSIVE METABOLIC PANEL - Abnormal; Notable for the following components:   Potassium 3.4 (*)    CO2 21 (*)    Calcium 8.5 (*)    All other components within normal limits  CBC - Abnormal; Notable for the following components:   Hemoglobin 8.8 (*)    HCT 29.2 (*)    MCV 67.9 (*)    MCH 20.5 (*)    RDW 19.6 (*)    Platelets 431 (*)    All other components within normal limits  LIPASE, BLOOD  I-STAT BETA HCG BLOOD, ED ([facility], [facility], [facility] ONLY)    EKG None  Radiology CT Renal Stone Study  Result Date: 12/22/2020 CLINICAL DATA:  Left-sided flank pain for 1 day, initial encounter EXAM: CT ABDOMEN AND PELVIS WITHOUT CONTRAST TECHNIQUE: Multidetector CT imaging of the abdomen and pelvis was performed following the standard protocol without IV contrast. COMPARISON:  Ultrasound from 03/31/2019 FINDINGS: Lower chest: No acute abnormality. Hepatobiliary: No focal liver abnormality is seen. No gallstones, gallbladder wall thickening, or biliary dilatation. Pancreas: Unremarkable. No pancreatic ductal dilatation or surrounding inflammatory changes. Spleen: Normal in size without focal abnormality. Adrenals/Urinary Tract: Adrenal glands are within normal limits. Kidneys show a normal appearance. No renal calculi or obstructive changes are seen. The ureters are within normal limits. Bladder is well distended. Stomach/Bowel: Scattered mild diverticular changes noted within the colon without evidence of diverticulitis. The appendix is within normal limits. Small bowel and stomach are unremarkable. Vascular/Lymphatic: No significant vascular findings are present. No enlarged abdominal or pelvic lymph nodes. Reproductive: Uterus is significantly bulky although evaluation is somewhat limited due to the lack of IV contrast. A dominant fibroid measuring at least 8 cm is noted increased in size when compared with the prior ultrasound examination. No  definitive adnexal abnormality is noted. Other: No abdominal wall hernia or abnormality. No abdominopelvic ascites. Musculoskeletal: No acute or significant osseous findings. IMPRESSION: Enlarged fibroid uterus increased when compare with the prior ultrasound from 2021. No renal calculi or obstructive changes are seen. Mild diverticulosis without diverticulitis. Electronically Signed   By: Inez Catalina M.D.   On: 12/22/2020 20:51    Procedures Procedures   Medications Ordered in ED Medications  ondansetron (ZOFRAN-ODT) disintegrating tablet 4 mg (4 mg Oral Given 12/22/20 1120)  HYDROcodone-acetaminophen (NORCO/VICODIN) 5-325 MG per tablet 1 tablet (1 tablet Oral Given 12/22/20 2030)    ED Course  I have reviewed the triage vital signs and the nursing notes.  Pertinent labs & imaging results that were available during my care of the patient were reviewed by  me and considered in my medical decision making (see chart for details).    MDM Rules/Calculators/A&P                           40 year old female presents emergency department with 1 day of left-sided flank pain.  Seems movement related, worse with palpation.  Vitals are normal and stable.  Abdomen is benign.  Blood work is reassuring, shows a baseline anemia, urinalysis without blood or infection.  However patient recalls that she was told she has history of kidney stones.  CT renal study identified no renal stones.    After medication patient states her symptoms have resolved.  Educated the patient about musculoskeletal pain into evaluate the area for developing rash/shingles which there is no evidence of at this time.  Patient verbalizes understanding and has no further questions.  Patient at this time appears safe and stable for discharge and will be treated as an outpatient.  Discharge plan and strict return to ED precautions discussed, patient verbalizes understanding and agreement.  Final Clinical Impression(s) / ED Diagnoses Final  diagnoses:  Flank pain    Rx / DC Orders ED Discharge Orders          Ordered    naproxen (NAPROSYN) 375 MG tablet  2 times daily        12/22/20 2202             Lorelle Gibbs, DO 12/22/20 2244

## 2020-12-22 NOTE — Discharge Instructions (Addendum)
You have been seen and discharged from the emergency department.  Your blood work was normal, CAT scan showed no abnormality.  I believe your pain is most likely musculoskeletal.  Take medicine as prescribed.  Watch the area for potential rash formation in the next couple days, if the rash forms this could be shingles which is very important to get evaluated and treated.  Follow-up with your primary provider for reevaluation and further care. Take home medications as prescribed. If you have any worsening symptoms or further concerns for your health please return to an emergency department for further evaluation.

## 2020-12-22 NOTE — ED Triage Notes (Signed)
Patient reports left flank pain rated 10/10, started today. Endorses nausea/vomiting.

## 2021-02-02 NOTE — Progress Notes (Signed)
Established Patient Office Visit  Subjective:  Patient ID: Tracey Gill, female    DOB: [birth year]  Age: 40 y.o. MRN: [id number]  CC:  Chief Complaint  Patient presents with   Hypertension    HPI Fallon Haecker presents for re-establish PCP  last seen 02/2019 by [facility] This patient visit today was assisted by video Spanish interpreter Vicente Males [phone].  Patient with prior history of hypertension elevated LDL mild elevation in BMI.  Patient has been compliant with his blood pressure medication and on arrival blood pressure was good 136/82.  The patient has however been to the emergency room in November with complaints of left flank pain and prior to this had been evaluated in August by one of my colleagues in the mobile medicine unit and was found to be anemic.  She has had excess heavy menstrual bleeding.  She does have a gynecologist she seen previously.  She has 3 children ages 67 and 50.  Patient is sexually active her husband uses a condom but she does not wish any birth control measures.  Patient on arrival is due flu vaccine and tetanus vaccine she received both at this visit.  Patient is also due a Pap smear  No other complaints  flu pap tdap   Cardiovascular and Mediastinum    Essential hypertension - Primary    Relevant Medications    amLODipine (NORVASC) 5 MG tablet    Other Relevant Orders    CBC with Differential/Platelet    Comp. Metabolic Panel (12)    TSH    Other Visit Diagnoses       Screening, lipid        Relevant Orders    Lipid panel    BMI 27.0-27.9,adult            Past Medical History:  Diagnosis Date   Anemia    Hypertension    Known health problems: none     Past Surgical History:  Procedure Laterality Date   removal of benign right breast mass Left     Family History  Problem Relation Age of Onset   Diabetes Mother    Diabetes Father    Heart disease Father    Diabetes Sister     Social History   Socioeconomic  History   Marital status: Married    Spouse name: Not on file   Number of children: 3   Years of education: Not on file   Highest education level: Not on file  Occupational History   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  Tobacco Use   Smoking status: Never   Smokeless tobacco: Never  Vaping Use   Vaping Use: Never used  Substance and Sexual Activity   Alcohol use: No   Drug use: No   Sexual activity: Yes  Other Topics Concern   Not on file  Social History Narrative   Not on file   Social Determinants of Health   Financial Resource Strain: Not on file  Food Insecurity: Not on file  Transportation Needs: Not on file  Physical Activity: Not on file  Stress: Not on file  Social Connections: Not on file  Intimate Partner Violence: Not on file    Outpatient Medications Prior to Visit  Medication Sig Dispense Refill   amLODipine (NORVASC) 5 MG tablet Take 1 tablet (5 mg total) by mouth daily. To lower blood pressure 30 tablet 1   Iron, Ferrous Sulfate, 325 (65 Fe) MG TABS Take 325 mg by mouth daily. Increase to BID  after one week if tolerated 60 tablet 1   naproxen (NAPROSYN) 375 MG tablet Take 1 tablet (375 mg total) by mouth 2 (two) times daily. (Patient not taking: Reported on 02/03/2021) 20 tablet 0   No facility-administered medications prior to visit.    No Known Allergies  ROS Review of Systems  Constitutional:  Negative for chills, diaphoresis and fever.  HENT:  Negative for congestion, hearing loss, nosebleeds, sore throat and tinnitus.   Eyes:  Negative for photophobia and redness.  Respiratory:  Negative for cough, shortness of breath, wheezing and stridor.   Cardiovascular:  Negative for chest pain, palpitations and leg swelling.  Gastrointestinal:  Negative for abdominal pain, blood in stool, constipation, diarrhea, nausea and vomiting.  Endocrine: Negative for polydipsia.  Genitourinary:  Positive for flank pain and menstrual problem. Negative for dysuria, frequency,  hematuria and urgency.  Musculoskeletal:  Negative for back pain, myalgias and neck pain.  Skin:  Negative for rash.  Allergic/Immunologic: Negative for environmental allergies.  Neurological:  Negative for dizziness, tremors, seizures, weakness and headaches.  Hematological:  Does not bruise/bleed easily.  Psychiatric/Behavioral:  Negative for suicidal ideas. The patient is not nervous/anxious.      Objective:    Physical Exam Vitals reviewed.  Constitutional:      Appearance: Normal appearance. She is well-developed. She is obese. She is not diaphoretic.  HENT:     Head: Normocephalic and atraumatic.     Nose: Nose normal. No nasal deformity, septal deviation, mucosal edema or rhinorrhea.     Right Sinus: No maxillary sinus tenderness or frontal sinus tenderness.     Left Sinus: No maxillary sinus tenderness or frontal sinus tenderness.     Mouth/Throat:     Mouth: Mucous membranes are moist.     Pharynx: Oropharynx is clear. No oropharyngeal exudate.  Eyes:     General: No scleral icterus.    Conjunctiva/sclera: Conjunctivae normal.     Pupils: Pupils are equal, round, and reactive to light.  Neck:     Thyroid: No thyromegaly.     Vascular: No carotid bruit or JVD.     Trachea: Trachea normal. No tracheal tenderness or tracheal deviation.  Cardiovascular:     Rate and Rhythm: Normal rate and regular rhythm.     Chest Wall: PMI is not displaced.     Pulses: Normal pulses. No decreased pulses.     Heart sounds: Normal heart sounds, S1 normal and S2 normal. Heart sounds not distant. No murmur heard. No systolic murmur is present.  No diastolic murmur is present.    No friction rub. No gallop. No S3 or S4 sounds.  Pulmonary:     Effort: Pulmonary effort is normal. No tachypnea, accessory muscle usage or respiratory distress.     Breath sounds: Normal breath sounds. No stridor. No decreased breath sounds, wheezing, rhonchi or rales.  Chest:     Chest wall: No tenderness.   Abdominal:     General: Bowel sounds are normal. There is no distension.     Palpations: Abdomen is soft. Abdomen is not rigid.     Tenderness: There is no abdominal tenderness. There is no guarding or rebound.  Musculoskeletal:        General: Normal range of motion.     Cervical back: Normal range of motion and neck supple. No edema, erythema or rigidity. No muscular tenderness. Normal range of motion.  Lymphadenopathy:     Head:     Right side of head: No  submental or submandibular adenopathy.     Left side of head: No submental or submandibular adenopathy.     Cervical: No cervical adenopathy.  Skin:    General: Skin is warm and dry.     Coloration: Skin is not pale.     Findings: No rash.     Nails: There is no clubbing.  Neurological:     General: No focal deficit present.     Mental Status: She is alert and oriented to person, place, and time.     Sensory: No sensory deficit.  Psychiatric:        Mood and Affect: Mood normal.        Speech: Speech normal.        Behavior: Behavior normal.        Thought Content: Thought content normal.        Judgment: Judgment normal.    BP 136/82    Pulse 74    Resp 16    Wt 156 lb 6.4 oz (70.9 kg)    SpO2 100%    BMI 28.61 kg/m  Wt Readings from Last 3 Encounters:  02/03/21 156 lb 6.4 oz (70.9 kg)  12/22/20 155 lb (70.3 kg)  09/16/20 153 lb (69.4 kg)    Abd CT renal stone study 12/2020 IMPRESSION: Enlarged fibroid uterus increased when compare with the prior ultrasound from 2021. No renal calculi or obstructive changes are seen. Mild diverticulosis without diverticul Health Maintenance Due  Topic Date Due   Hepatitis C Screening  Never done   PAP SMEAR-Modifier  01/28/2014   COVID-19 Vaccine (3 - Booster for Pfizer series) 07/25/2019    There are no preventive care reminders to display for this patient.  Lab Results  Component Value Date   TSH 1.620 09/30/2020   Lab Results  Component Value Date   WBC 6.8 12/22/2020    HGB 8.8 (L) 12/22/2020   HCT 29.2 (L) 12/22/2020   MCV 67.9 (L) 12/22/2020   PLT 431 (H) 12/22/2020   Lab Results  Component Value Date   NA 136 12/22/2020   K 3.4 (L) 12/22/2020   CO2 21 (L) 12/22/2020   GLUCOSE 90 12/22/2020   BUN 12 12/22/2020   CREATININE 0.48 12/22/2020   BILITOT 0.4 12/22/2020   ALKPHOS 54 12/22/2020   AST 17 12/22/2020   ALT 13 12/22/2020   PROT 6.9 12/22/2020   ALBUMIN 4.0 12/22/2020   CALCIUM 8.5 (L) 12/22/2020   ANIONGAP 7 12/22/2020   EGFR 123 09/30/2020   Lab Results  Component Value Date   CHOL 198 09/30/2020   Lab Results  Component Value Date   HDL 52 09/30/2020   Lab Results  Component Value Date   LDLCALC 120 (H) 09/30/2020   Lab Results  Component Value Date   TRIG 146 09/30/2020   Lab Results  Component Value Date   CHOLHDL 3.8 09/30/2020   No results found for: HGBA1C    Assessment & Plan:   Problem List Items Addressed This Visit       Cardiovascular and Mediastinum   Essential hypertension    Blood pressure is stable at this time plan to renew amlodipine refills given      Relevant Medications   amLODipine (NORVASC) 5 MG tablet     Genitourinary   Intramural and subserous leiomyoma of uterus    Suspect intramural fibroids are etiology to patient's excess menstrual periods we will plan to refer to gynecology she also needs a Pap smear  Relevant Orders   Ambulatory referral to Gynecology     Other   Anemia - Primary    Iron deficiency anemia as evidenced by low MCV and following hemoglobin excess menstrual periods  Plan to check iron levels and refer to gynecology for further evaluations      Relevant Medications   ferrous sulfate 325 (65 FE) MG tablet   Other Relevant Orders   Iron, TIBC and Ferritin Panel   Ambulatory referral to Gynecology   Other Visit Diagnoses     Need for hepatitis C screening test       Relevant Orders   HCV Ab w Reflex to Quant PCR   Encounter for screening for  HIV       Relevant Orders   HIV Antibody (routine testing w rflx)   Cervical cancer screening       Relevant Orders   Ambulatory referral to Gynecology   Need for immunization against influenza       Relevant Orders   Flu Vaccine QUAD 3moIM (Fluarix, Fluzone & Alfiuria Quad PF) (Completed)       Meds ordered this encounter  Medications   amLODipine (NORVASC) 5 MG tablet    Sig: Take 1 tablet (5 mg total) by mouth daily. To lower blood pressure    Dispense:  90 tablet    Refill:  1   ferrous sulfate 325 (65 FE) MG tablet    Sig: Take 1 tablet (325 mg total) by mouth daily.    Dispense:  60 tablet    Refill:  1    Follow-up: Return in about 4 months (around 06/04/2021).    PAsencion Noble MD

## 2021-02-03 ENCOUNTER — Ambulatory Visit: Payer: Self-pay | Attending: Critical Care Medicine | Admitting: Critical Care Medicine

## 2021-02-03 ENCOUNTER — Encounter: Payer: Self-pay | Admitting: Critical Care Medicine

## 2021-02-03 ENCOUNTER — Other Ambulatory Visit: Payer: Self-pay

## 2021-02-03 VITALS — BP 136/82 | HR 74 | Resp 16 | Wt 156.4 lb

## 2021-02-03 DIAGNOSIS — D251 Intramural leiomyoma of uterus: Secondary | ICD-10-CM | POA: Insufficient documentation

## 2021-02-03 DIAGNOSIS — D252 Subserosal leiomyoma of uterus: Secondary | ICD-10-CM | POA: Insufficient documentation

## 2021-02-03 DIAGNOSIS — Z1159 Encounter for screening for other viral diseases: Secondary | ICD-10-CM

## 2021-02-03 DIAGNOSIS — I10 Essential (primary) hypertension: Secondary | ICD-10-CM

## 2021-02-03 DIAGNOSIS — Z114 Encounter for screening for human immunodeficiency virus [HIV]: Secondary | ICD-10-CM

## 2021-02-03 DIAGNOSIS — D509 Iron deficiency anemia, unspecified: Secondary | ICD-10-CM

## 2021-02-03 DIAGNOSIS — Z23 Encounter for immunization: Secondary | ICD-10-CM

## 2021-02-03 DIAGNOSIS — Z124 Encounter for screening for malignant neoplasm of cervix: Secondary | ICD-10-CM

## 2021-02-03 HISTORY — DX: Intramural leiomyoma of uterus: D25.1

## 2021-02-03 MED ORDER — FERROUS SULFATE 325 (65 FE) MG PO TABS
325.0000 mg | ORAL_TABLET | Freq: Every day | ORAL | 1 refills | Status: DC
  Filled 2021-02-03: qty 60, 30d supply, fill #0
  Filled 2021-02-03: qty 30, 15d supply, fill #0

## 2021-02-03 MED ORDER — AMLODIPINE BESYLATE 5 MG PO TABS
5.0000 mg | ORAL_TABLET | Freq: Every day | ORAL | 1 refills | Status: DC
  Filled 2021-02-03: qty 30, 30d supply, fill #0
  Filled 2021-03-27: qty 90, 90d supply, fill #0

## 2021-02-03 NOTE — Assessment & Plan Note (Signed)
Iron deficiency anemia as evidenced by low MCV and following hemoglobin excess menstrual periods  Plan to check iron levels and refer to gynecology for further evaluations

## 2021-02-03 NOTE — Assessment & Plan Note (Signed)
Blood pressure is stable at this time plan to renew amlodipine refills given

## 2021-02-03 NOTE — Patient Instructions (Addendum)
Referral to gynecology will be made for your fibroids  Refills on medications sent to our pharmacy  Flu shot and tetanus vaccine was given  Stop by the lab for blood work to include HIV hepatitis C and iron levels  Return to see Dr. Joya Gaskins 4 months   Se realizar una derivacin a ginecologa para sus fibromas.  Resurtidos de medicamentos enviados a nuestra farmacia  Se administr vacuna antigripal y antitetnica  Pase por el laboratorio para un anlisis de sangre que [place] VIH, la hepatitis C y los niveles de hierro.  Volver a ver al Dr. Joya Gaskins 4 meses

## 2021-02-03 NOTE — Assessment & Plan Note (Signed)
Suspect intramural fibroids are etiology to patient's excess menstrual periods we will plan to refer to gynecology she also needs a Pap smear

## 2021-02-04 ENCOUNTER — Other Ambulatory Visit: Payer: Self-pay

## 2021-02-04 ENCOUNTER — Other Ambulatory Visit: Payer: Self-pay | Admitting: Critical Care Medicine

## 2021-02-04 LAB — HCV INTERPRETATION

## 2021-02-04 LAB — IRON,TIBC AND FERRITIN PANEL
Ferritin: 6 ng/mL — ABNORMAL LOW (ref 15–150)
Iron Saturation: 4 % — CL (ref 15–55)
Iron: 15 ug/dL — ABNORMAL LOW (ref 27–159)
Total Iron Binding Capacity: 354 ug/dL (ref 250–450)
UIBC: 339 ug/dL (ref 131–425)

## 2021-02-04 LAB — HIV ANTIBODY (ROUTINE TESTING W REFLEX): HIV Screen 4th Generation wRfx: NONREACTIVE

## 2021-02-04 LAB — HCV AB W REFLEX TO QUANT PCR: HCV Ab: <0.1 s/co ratio (ref 0.0–0.9)

## 2021-02-04 MED ORDER — FERROUS SULFATE 325 (65 FE) MG PO TABS
325.0000 mg | ORAL_TABLET | Freq: Three times a day (TID) | ORAL | 1 refills | Status: DC
  Filled 2021-02-04: qty 90, 30d supply, fill #0

## 2021-02-05 ENCOUNTER — Telehealth: Payer: Self-pay

## 2021-02-05 NOTE — Telephone Encounter (Signed)
Pt was called and vm was left. Information was sent to nurse pool and letter will be sent.   Interpreter:Laura Number: [id number]

## 2021-02-05 NOTE — Telephone Encounter (Signed)
-----   Message from Elsie Stain, MD sent at 02/04/2021  7:49 AM EST ----- Let pt know iron levels very low due to chronic blood loss from uterus.  She needs to increase iron to one tablet with meals three times daily ,rx sent to our pharmacy

## 2021-02-11 ENCOUNTER — Other Ambulatory Visit: Payer: Self-pay

## 2021-03-27 ENCOUNTER — Other Ambulatory Visit: Payer: Self-pay

## 2021-04-17 ENCOUNTER — Telehealth: Payer: Self-pay

## 2021-04-17 ENCOUNTER — Ambulatory Visit: Payer: Self-pay

## 2021-04-17 NOTE — Telephone Encounter (Signed)
Pt is calling back about her appointment for [place]. Please advise [phone] ?

## 2021-04-17 NOTE — Telephone Encounter (Signed)
?  Chief Complaint: excessive menstrual bleeding ?Symptoms: heavy period bleeding to where pt has to change diaper pads 10-15/day, abdominal pain 10/10, dizziness and lightheaded ?Frequency: 3 days ?Pertinent Negatives: NA ?Disposition: [] ED /[] Urgent Care (no appt availability in office) / [] Appointment(In office/virtual)/ []  [facility]/ [] Home Care/ [x] Refused Recommended Disposition /[] [facility]/ []  Follow-up with PCP ?Additional Notes: I advised pt's daughter that was translating for her pt would need to go to ED to be seen but she was asking for appt sooner and I let her know we scheduled for 05/14/21 and I felt like pt needed to be seen before then and pt reported if symptoms got worse she would go to to ED wanted to wait for appt  ? ?Reason for Disposition ? MODERATE vaginal bleeding (e.g., soaking 1 pad or tampon per hour and present > 6 hours; 1 menstrual cup every 6 hours) ? ?Answer Assessment - Initial Assessment Questions ?1. AMOUNT: "Describe the bleeding that you are having."  ?  - SPOTTING: spotting, or pinkish / brownish mucous discharge; does not fill panty liner or pad  ?  - MILD:  less than 1 pad / hour; less than patient's usual menstrual bleeding ?  - MODERATE: 1-2 pads / hour; 1 menstrual cup every 6 hours; small-medium blood clots (e.g., pea, grape, small coin) ?  - SEVERE: soaking 2 or more pads/hour for 2 or more hours; 1 menstrual cup every 2 hours; bleeding not contained by pads or continuous red blood from vagina; large blood clots (e.g., golf ball, large coin)  ?    10-15 diaper pads a day  ?2. ONSET: "When did the bleeding begin?" "Is it continuing now?" ?    04/15/21 ?3. MENSTRUAL PERIOD: "When was the last normal menstrual period?" "How is this different than your period?" ?    Been the same for the past couple of periods  ?5. ABDOMINAL PAIN: "Do you have any pain?" "How bad is the pain?"  (e.g., Scale 1-10; mild, moderate, or severe) ?  - MILD (1-3): doesn't  interfere with normal activities, abdomen soft and not tender to touch  ?  - MODERATE (4-7): interferes with normal activities or awakens from sleep, abdomen tender to touch  ?  - SEVERE (8-10): excruciating pain, doubled over, unable to do any normal activities  ?    10 ?8. HORMONES: "Are you taking any hormone medications, prescription or OTC?" (e.g., birth control pills, estrogen) ?    No ?9. BLOOD THINNERS: "Do you take any blood thinners?" (e.g., Coumadin/warfarin, Pradaxa/dabigatran, aspirin) ?    No ?10. CAUSE: "What do you think is causing the bleeding?" (e.g., recent gyn surgery, recent gyn procedure; known bleeding disorder, cervical cancer, polycystic ovarian disease, fibroids)   ?      Fibroma  ?11. HEMODYNAMIC STATUS: "Are you weak or feeling lightheaded?" If Yes, ask: "Can you stand and walk normally?"  ?      Feeling lightheaded and dizzy ? ?Protocols used: Vaginal Bleeding - Abnormal-A-AH ? ?

## 2021-04-17 NOTE — Telephone Encounter (Signed)
Copied from [facility] [id number]. Topic: General - Other ?>> Apr 17, 2021  1:36 PM Yvette Rack wrote: ?Reason for CRM: Pt stated she has not been contacted to schedule an appt for ultrasound. Pt requests call back. Cb# [phone] ? ? ? ? ? ?*called pt and left vm about appointment* ? ?Interpreter SL#[id number] ?

## 2021-04-18 NOTE — Telephone Encounter (Signed)
Referral was placed in December ? ?Tracey Gill can we f/u and find out why pt not yet seen she has seen [facility] in the past ? ? ?

## 2021-04-18 NOTE — Telephone Encounter (Signed)
Called pt and talked with her, told her that she needed call her gyn for the bleeding(fibroids)and scan  ? ? ?Interpreter ID# [id number] ?

## 2021-05-14 ENCOUNTER — Ambulatory Visit: Payer: Self-pay | Attending: Critical Care Medicine | Admitting: Physician Assistant

## 2021-05-14 ENCOUNTER — Encounter: Payer: Self-pay | Admitting: Physician Assistant

## 2021-05-14 ENCOUNTER — Other Ambulatory Visit: Payer: Self-pay

## 2021-05-14 VITALS — BP 152/92 | HR 75 | Wt 156.4 lb

## 2021-05-14 DIAGNOSIS — N924 Excessive bleeding in the premenopausal period: Secondary | ICD-10-CM

## 2021-05-14 DIAGNOSIS — D509 Iron deficiency anemia, unspecified: Secondary | ICD-10-CM

## 2021-05-14 DIAGNOSIS — I10 Essential (primary) hypertension: Secondary | ICD-10-CM

## 2021-05-14 DIAGNOSIS — N921 Excessive and frequent menstruation with irregular cycle: Secondary | ICD-10-CM

## 2021-05-14 DIAGNOSIS — F5089 Other specified eating disorder: Secondary | ICD-10-CM

## 2021-05-14 MED ORDER — AMLODIPINE BESYLATE 5 MG PO TABS: 5.0000 mg | ORAL_TABLET | Freq: Every day | ORAL | 1 refills | Status: DC

## 2021-05-14 MED ORDER — FERROUS SULFATE 325 (65 FE) MG PO TABS: 325.0000 mg | ORAL_TABLET | Freq: Three times a day (TID) | ORAL | 1 refills | Status: DC

## 2021-05-14 MED ORDER — NORETHINDRONE ACET-ETHINYL EST 1-20 MG-MCG PO TABS: 1.0000 | ORAL_TABLET | Freq: Every day | ORAL | 11 refills | Status: DC

## 2021-05-14 NOTE — Patient Instructions (Signed)
Drink 80-100 ounces water daily.  Take 1 iron tablet 3 times daily  ? ?Anemia ?Anemia ?La anemia es una afecci?n en la que no hay una cantidad suficiente de gl?bulos rojos o Scientist, research (medical). La hemoglobina es la sustancia de los gl?bulos rojos que transporta el ox?geno. ?Cuando no hay suficientes gl?bulos rojos o hemoglobina (est? an?mico), su cuerpo no puede recibir el ox?geno suficiente, y es posible que sus ?rganos no funcionen correctamente. [place], es posible que se sienta muy cansado o sufra otros problemas. ??Cu?les son las causas? ?Las causas m?s frecuentes de anemia son: ?Elvina Mattes. La anemia puede ser causada por un sangrado excesivo dentro o fuera del cuerpo, incluido sangrado de los intestinos o a causa de per?odos menstruales abundantes en las mujeres. ?Nutrici?n deficiente. ?Enfermedad hep?tica, tiroidea o renal (cr?nicas). ?Trastornos de la m?dula ?sea, problemas en el bazo y trastornos de Herbalist. ?C?ncer y tratamientos para el c?ncer. ?VIH (virus de inmunodeficiencia humana) y [place] (s?ndrome de inmunodeficiencia adquirida). ?Infecciones, medicamentos y enfermedades autoinmunes que [employer] gl?bulos rojos. ??Cu?les son los signos o s?ntomas? ?Los s?ntomas de esta afecci?n incluyen: ?Debilidad leve. ?Mareos. ?Dolor de [place] o dificultad para concentrarse y dormir. ?Latidos card?acos irregulares o m?s r?pidos que lo normal (palpitaciones). ?Falta de aire, especialmente con el ejercicio. ?Piel, labios y u?as p?lidos, o manos y pies fr?os. ?Indigesti?n y n?useas. ?Los s?ntomas pueden ocurrir repentinamente o Administrator, Civil Service. Si la anemia es leve, es posible que no tenga s?ntomas. ??C?mo se diagnostica? ?Esta afecci?n se diagnostica en funci?n de an?lisis de sangre, los antecedentes m?dicos y un examen f?sico. En algunos casos, se puede necesitar una prueba en la que se extraen c?lulas del tejido blando que est? dentro de un hueso y se las observa con un  microscopio (biopsia de m?dula ?sea). Adem?s, el m?dico puede controlar si hay sangre en sus heces (materia fecal) y Optometrist an?lisis adicionales para Hydrographic surveyor la causa del sangrado. ?Otras pruebas que pueden realizarle son las siguientes: ?Pruebas de diagn?stico por im?genes, como una resonancia magn?tica (RM) o una exploraci?n por tomograf?a computarizada (TC). ?Un procedimiento para examinar el interior del es?fago y el est?mago (endoscop?a). ?Un procedimiento para examinar el interior del colon y el recto (colonoscop?a). ??C?mo se trata? ?El tratamiento de esta afecci?n depende de la causa. Si contin?a perdiendo [employer], es posible que necesite recibir tratamiento en un hospital. El tratamiento puede incluir: ?Tomar suplementos de hierro, vitamina Z61 o ?cido f?lico. ?Tomar un medicamento para las hormonas (eritropoyetina) que puede ayudar a Engineer, maintenance (IT) de gl?bulos rojos. ?Recibir una transfusi?n de [place]. Esta ser? necesaria si pierde [employer]. ?Realizar cambios en la dieta. ?Someterse a Probation officer?a para extirpar el bazo. ?Siga estas instrucciones en su casa: ?Use los medicamentos de venta libre y los recetados solamente como se lo haya indicado el m?dico. ?Tome los suplementos solamente como se lo haya indicado el m?dico. ?Siga las instrucciones del m?dico en lo que respecta a la dieta. ?Concurra a todas las visitas de seguimiento como se lo haya indicado el m?dico. Esto es importante. ?Comun?quese con un m?dico si: ?Tiene nuevos sangrados en cualquier parte del cuerpo. ?Solicite ayuda de inmediato si: ?Se siente muy d?bil. ?Le falta el aire. ?Siente dolor en la espalda, el abdomen o el pecho. ?Se siente mareado o sufre un desmayo. ?Tiene dificultad para concentrarse. ?Sus heces tienen [place], son Laurence Aly o son alquitranadas. ?Vomita repetidamente o vomita sangre. ?Estos s?ntomas pueden representar un problema grave que constituye Engineer, maintenance (IT). No espere  a ver si los s?ntomas  desaparecen. Solicite atenci?n m?dica de inmediato. Comun?quese con el servicio de emergencias de su localidad (911 en los [place]). No conduzca por sus propios medios [employer]. ?Resumen ?La anemia es una afecci?n en la que no hay suficientes gl?bulos rojos o la cantidad suficiente de la sustancia de los gl?bulos rojos que transporta el ox?geno (hemoglobina). ?Los s?ntomas pueden ocurrir repentinamente o Administrator, Civil Service. ?Si la anemia es leve, es posible que no tenga s?ntomas. ?Esta afecci?n se diagnostica mediante an?lisis de sangre, los antecedentes m?dicos y un examen f?sico. Pueden ser necesarios otros estudios. ?El tratamiento de esta afecci?n depende de la causa de la anemia. ?Esta informaci?n no tiene Marine scientist el consejo del m?dico. Aseg?rese de hacerle al m?dico cualquier pregunta que tenga. ?Document Revised: 03/09/2019 Document Reviewed: 03/09/2019 ?Elsevier Patient Education ? [place]. ? ?

## 2021-05-14 NOTE — Progress Notes (Signed)
Patient ID: Tracey Gill, female   DOB: [birth year], 41 y.o.   MRN: [id number] ? ? ?Tracey Gill, is a 41 y.o. female ? ?[id number] ? ?[id number] ? ?DOB - [birth year] ? ?Chief Complaint  ?Patient presents with  ? Menorrhagia  ?    ? ?Subjective:  ? ?Tracey Gill is a 41 y.o. female here today for progressively worsening menorrhagia.  Sometimes having 2 periods per month.  This has gotten progressively worse over the last 8 months.  At time she has used up to 15 pads per day.  Legs feel weak.  She is taking iron once daily. She says she has been diagnosed with fibroids a couple of years ago but hasnt seen gyn since.  No CP/SOB.  She does complain of fatigue and Pica with dirt cravings.  Her daughter is here translating.  She also has hot and cold intolerance ? ? ?No problems updated. ? ?ALLERGIES: ?No Known Allergies ? ?PAST MEDICAL HISTORY: ?Past Medical History:  ?Diagnosis Date  ? Anemia   ? Hypertension   ? Known health problems: none   ? ? ?MEDICATIONS AT HOME: ?Prior to Admission medications   ?Medication Sig Start Date End Date Taking? Authorizing Provider  ?norethindrone-ethinyl estradiol (LOESTRIN 1/20, 21,) 1-20 MG-MCG tablet Take 1 tablet by mouth daily. 05/14/21  Yes Argentina Donovan, PA-C  ?amLODipine (NORVASC) 5 MG tablet Take 1 tablet (5 mg total) by mouth daily. To lower blood pressure 05/14/21   Argentina Donovan, PA-C  ?ferrous sulfate 325 (65 FE) MG tablet Take 1 tablet (325 mg total) by mouth 3 (three) times daily with meals. 05/14/21   Argentina Donovan, PA-C  ? ? ?ROS: ?Neg HEENT ?Neg resp ?Neg cardiac ?Neg GI ?Neg GU ?Neg MS ?Neg psych ?Neg neuro ? ?Objective:  ? ?Vitals:  ? 05/14/21 1339  ?BP: (!) 152/92  ?Pulse: 75  ?SpO2: 100%  ?Weight: 156 lb 6.4 oz (70.9 kg)  ? ?Exam ?General appearance : Awake, alert, not in any distress. Speech Clear. Not toxic looking ?HEENT: Atraumatic and Normocephalic ?Neck: Supple, no JVD. No cervical lymphadenopathy.  ?Chest: Good air  entry bilaterally, CTAB.  No rales/rhonchi/wheezing ?CVS: S1 S2 regular, no murmurs.  ?Extremities: B/L Lower Ext shows no edema, both legs are warm to touch ?Neurology: Awake alert, and oriented X 3, CN II-XII intact, Non focal ?Skin: No Rash ? ?Data Review ?No results found for: HGBA1C ? ?Assessment & Plan  ? ?1. Iron deficiency anemia, unspecified iron deficiency anemia type ?Increase iron to tid and start OCP to lessen periods until she can see gyn.  Drink 80-100 ounces water daily.  She is not a smoker ?- Ambulatory referral to Gynecology ?- Thyroid Panel With TSH ?- ferrous sulfate 325 (65 FE) MG tablet; Take 1 tablet (325 mg total) by mouth 3 (three) times daily with meals.  Dispense: 90 tablet; Refill: 1 ? ?2. Excessive bleeding in premenopausal period ?- Ambulatory referral to Gynecology ? ?3. Menorrhagia with irregular cycle ?- CBC with Differential/Platelet ?- Iron, TIBC and Ferritin Panel ?- Ambulatory referral to Gynecology ?- Thyroid Panel With TSH ?- norethindrone-ethinyl estradiol (LOESTRIN 1/20, 21,) 1-20 MG-MCG tablet; Take 1 tablet by mouth daily.  Dispense: 28 tablet; Refill: 11 ? ?4. Pica in adults ?Discussed importance of restraining and patient verbalizes understanding ?- Ambulatory referral to Gynecology ? ?5. Essential hypertension ?Take meds and check BP OOO. We have discussed target BP range and blood pressure goal. I have advised patient to check BP regularly and to  call us back or report to clinic if the numbers are consistently higher than 140/90. We discussed the importance of compliance with medical therapy and DASH diet recommended, consequences of uncontrolled hypertension discussed.  ?- amLODipine (NORVASC) 5 MG tablet; Take 1 tablet (5 mg total) by mouth daily. To lower blood pressure  Dispense: 90 tablet; Refill: 1 ?- Comprehensive metabolic panel ? ? ? ?Patient have been counseled extensively about nutrition and exercise. Other issues discussed during this visit include: low  cholesterol diet, weight control and daily exercise, foot care, annual eye examinations at Ophthalmology, importance of adherence with medications and regular follow-up. We also discussed long term complications of uncontrolled diabetes and hypertension.  ? ?Return in about 3 months (around 08/14/2021) for PCP-chronic conditions. ? ?The patient was given clear instructions to go to ER or return to medical center if symptoms don't improve, worsen or new problems develop. The patient verbalized understanding. The patient was told to call to get lab results if they haven't heard anything in the next week.  ? ? ? ? ?Freeman Caldron, PA-C ?[place] ?[place], [place] ?[phone]   ?05/14/2021, 1:52 PM  ?

## 2021-05-15 LAB — CBC WITH DIFFERENTIAL/PLATELET
Basophils Absolute: 0 10*3/uL (ref 0.0–0.2)
Basos: 0 %
EOS (ABSOLUTE): 0.4 10*3/uL (ref 0.0–0.4)
Eos: 6 %
Hematocrit: 30.6 % — ABNORMAL LOW (ref 34.0–46.6)
Hemoglobin: 9.2 g/dL — ABNORMAL LOW (ref 11.1–15.9)
Immature Grans (Abs): 0 10*3/uL (ref 0.0–0.1)
Immature Granulocytes: 0 %
Lymphocytes Absolute: 2.9 10*3/uL (ref 0.7–3.1)
Lymphs: 42 %
MCH: 22.8 pg — ABNORMAL LOW (ref 26.6–33.0)
MCHC: 30.1 g/dL — ABNORMAL LOW (ref 31.5–35.7)
MCV: 76 fL — ABNORMAL LOW (ref 79–97)
Monocytes Absolute: 0.5 10*3/uL (ref 0.1–0.9)
Monocytes: 8 %
Neutrophils Absolute: 3.1 10*3/uL (ref 1.4–7.0)
Neutrophils: 44 %
Platelets: 356 10*3/uL (ref 150–450)
RBC: 4.04 x10E6/uL (ref 3.77–5.28)
RDW: 18 % — ABNORMAL HIGH (ref 11.7–15.4)
WBC: 7 10*3/uL (ref 3.4–10.8)

## 2021-05-15 LAB — THYROID PANEL WITH TSH
Free Thyroxine Index: 1.7 (ref 1.2–4.9)
T3 Uptake Ratio: 23 % — ABNORMAL LOW (ref 24–39)
T4, Total: 7.3 ug/dL (ref 4.5–12.0)
TSH: 1.48 u[IU]/mL (ref 0.450–4.500)

## 2021-05-15 LAB — COMPREHENSIVE METABOLIC PANEL
ALT: 8 IU/L (ref 0–32)
AST: 12 IU/L (ref 0–40)
Albumin/Globulin Ratio: 1.8 (ref 1.2–2.2)
Albumin: 4.2 g/dL (ref 3.8–4.8)
Alkaline Phosphatase: 66 IU/L (ref 44–121)
BUN/Creatinine Ratio: 20 (ref 9–23)
BUN: 12 mg/dL (ref 6–20)
Bilirubin Total: <0.2 mg/dL (ref 0.0–1.2)
CO2: 24 mmol/L (ref 20–29)
Calcium: 8.5 mg/dL — ABNORMAL LOW (ref 8.7–10.2)
Chloride: 104 mmol/L (ref 96–106)
Creatinine, Ser: 0.61 mg/dL (ref 0.57–1.00)
Globulin, Total: 2.3 g/dL (ref 1.5–4.5)
Glucose: 89 mg/dL (ref 70–99)
Potassium: 4 mmol/L (ref 3.5–5.2)
Sodium: 139 mmol/L (ref 134–144)
Total Protein: 6.5 g/dL (ref 6.0–8.5)
eGFR: 117 mL/min/{1.73_m2} (ref >59)

## 2021-05-15 LAB — IRON,TIBC AND FERRITIN PANEL
Ferritin: 24 ng/mL (ref 15–150)
Iron Saturation: 3 % — CL (ref 15–55)
Iron: 12 ug/dL — ABNORMAL LOW (ref 27–159)
Total Iron Binding Capacity: 362 ug/dL (ref 250–450)
UIBC: 350 ug/dL (ref 131–425)

## 2021-06-16 ENCOUNTER — Encounter: Payer: Self-pay | Admitting: Obstetrics and Gynecology

## 2021-06-16 ENCOUNTER — Other Ambulatory Visit: Payer: Self-pay

## 2021-06-16 ENCOUNTER — Telehealth: Payer: Self-pay | Admitting: Family Medicine

## 2021-06-16 ENCOUNTER — Other Ambulatory Visit (HOSPITAL_COMMUNITY)
Admission: RE | Admit: 2021-06-16 | Discharge: 2021-06-16 | Disposition: A | Discharge status: Home / Self Care | Payer: Self-pay | Source: Ambulatory Visit | Attending: Obstetrics and Gynecology | Admitting: Obstetrics and Gynecology

## 2021-06-16 ENCOUNTER — Ambulatory Visit (INDEPENDENT_AMBULATORY_CARE_PROVIDER_SITE_OTHER): Payer: Self-pay | Admitting: Obstetrics and Gynecology

## 2021-06-16 VITALS — BP 151/96 | HR 65 | Ht 60.43 in | Wt 153.9 lb

## 2021-06-16 DIAGNOSIS — D500 Iron deficiency anemia secondary to blood loss (chronic): Secondary | ICD-10-CM

## 2021-06-16 DIAGNOSIS — N920 Excessive and frequent menstruation with regular cycle: Secondary | ICD-10-CM

## 2021-06-16 DIAGNOSIS — N898 Other specified noninflammatory disorders of vagina: Secondary | ICD-10-CM | POA: Insufficient documentation

## 2021-06-16 DIAGNOSIS — D251 Intramural leiomyoma of uterus: Secondary | ICD-10-CM

## 2021-06-16 DIAGNOSIS — Z01419 Encounter for gynecological examination (general) (routine) without abnormal findings: Secondary | ICD-10-CM | POA: Insufficient documentation

## 2021-06-16 DIAGNOSIS — D252 Subserosal leiomyoma of uterus: Secondary | ICD-10-CM

## 2021-06-16 MED ORDER — TRANEXAMIC ACID 650 MG PO TABS
1300.0000 mg | ORAL_TABLET | Freq: Three times a day (TID) | ORAL | 4 refills | Status: DC
  Filled 2021-06-16: qty 30, 28d supply, fill #0

## 2021-06-16 NOTE — Progress Notes (Signed)
? ? ?GYNECOLOGY ANNUAL PREVENTATIVE CARE ENCOUNTER NOTE ? ?History:    ? Tracey Gill is a 40 y.o. G6P3003 female SVD x 3,here for a routine annual gynecologic exam.  Current complaints: heavy bleeding.  Pt notes irregular menses, now having menses q 15 days.  Menses last 5-6 days. Menses are heavy for all 5 days.  Currently taking OCP, which have not been effective addressing the bleeding.  Pt has known fibroid last viewed 03/2019 ?Gynecologic History ?Gynecologic History ?Patient's last menstrual period was 05/31/2021. ?Contraception: OCP (estrogen/progesterone) ?Last Pap: 2020 with health department. Results were: normal with negative HPV ? ? ?Obstetric History ?OB History  ?Gravida Para Term Preterm AB Living  ?'3 3 3     3  '$ ?SAB IAB Ectopic Multiple Live Births  ?           ?  ?# Outcome Date GA Lbr Len/2nd Weight Sex Delivery Anes PTL Lv  ?3 Term           ?2 Term           ?1 Term           ? ? ?Past Medical History:  ?Diagnosis Date  ? Anemia   ? Hypertension   ? Known health problems: none   ? ? ?Past Surgical History:  ?Procedure Laterality Date  ? removal of benign right breast mass Left   ? ? ?Current Outpatient Medications on File Prior to Visit  ?Medication Sig Dispense Refill  ? amLODipine (NORVASC) 5 MG tablet Take 1 tablet (5 mg total) by mouth daily. To lower blood pressure 90 tablet 1  ? ferrous sulfate 325 (65 FE) MG tablet Take 1 tablet (325 mg total) by mouth 3 (three) times daily with meals. 90 tablet 1  ? norethindrone-ethinyl estradiol (LOESTRIN 1/20, 21,) 1-20 MG-MCG tablet Take 1 tablet by mouth daily. 28 tablet 11  ? ?No current facility-administered medications on file prior to visit.  ? ? ?No Known Allergies ? ?Social History:  reports that she has never smoked. She has never used smokeless tobacco. She reports that she does not drink alcohol and does not use drugs. ? ?Family History  ?Problem Relation Age of Onset  ? Diabetes Mother   ? Diabetes Father   ? Heart disease Father    ? Diabetes Sister   ?Pt notes several family members have had uterine cancer ? ?The following portions of the patient's history were reviewed and updated as appropriate: allergies, current medications, past family history, past medical history, past social history, past surgical history and problem list. ? ?Review of Systems ?Pertinent items noted in HPI and remainder of comprehensive ROS otherwise negative. ? ?Physical Exam:  ?BP (!) 151/96   Pulse 65   Ht 5' 0.43" (1.535 m)   Wt 69.8 kg   LMP 05/31/2021   BMI 29.63 kg/m?  ?CONSTITUTIONAL: Well-developed, well-nourished female in no acute distress.  ?HENT:  Normocephalic, atraumatic, External right and left ear normal. Oropharynx is clear and moist ?EYES: Conjunctivae and EOM are normal. ?NECK: Normal range of motion, supple, no masses.  Normal thyroid.  ?SKIN: Skin is warm and dry. No rash noted. Not diaphoretic. No erythema. No pallor. ?MUSCULOSKELETAL: Normal range of motion. No tenderness.  No cyanosis, clubbing, or edema.  2+ distal pulses. ?NEUROLOGIC: Alert and oriented to person, place, and time. Normal reflexes, muscle tone coordination.  ?PSYCHIATRIC: Normal mood and affect. Normal behavior. Normal judgment and thought content. ?CARDIOVASCULAR: Normal heart rate noted, regular rhythm ?RESPIRATORY:  Clear to auscultation bilaterally. Effort and breath sounds normal, no problems with respiration noted. ?BREASTS: deferred ?ABDOMEN: Soft, firm mass noted 2-3 cm below the umbilicus. Diffuse mild tenderness in the lower quadrants ?PELVIC: Normal appearing external genitalia and urethral meatus; normal appearing vaginal mucosa and cervix. Copious whitish green discharge/mucus in the vagina, swab taken  Pap smear obtained.  Enlarged uterine size, 14-16 weeks size no other palpable masses, no uterine or adnexal tenderness.  Performed in the presence of a chaperone. ?  ?Assessment and Plan:  ?  1. Intramural and subserous leiomyoma of uterus ?Pt had fibroid  seen on previous ultrasound, will get ultrasound to check current size ?- US PELVIC COMPLETE WITH TRANSVAGINAL; Future ? ?2. Iron deficiency anemia due to chronic blood loss ?Likely from menorrhagia ? ?3. Menorrhagia with regular cycle ?Pt will need endometrial biopsy at follow up visit ?Will stop OCP currently, especially with the medication being effective.  Trial of lysteda while still in evaluation phase ?- US PELVIC COMPLETE WITH TRANSVAGINAL; Future ? ?4. Women's annual routine gynecological examination ?See above findings ?- Cytology - PAP( [place]) ? ?5. Vaginal discharge ?Mucopurulent discharge noted in vagina, swab taken, will treat if infectious etiology noted ?- Cervicovaginal ancillary only( [facility]) ? ?Will follow up results of pap smear and manage accordingly. ?Mammogram scheduled ?Routine preventative health maintenance measures emphasized. ?Please refer to After Visit Summary for other counseling recommendations.  ?   ?F/u in 3 weeks to review ultrasound and to get endometrial biopsy ?Lynnda Shields, MD, FACOG ?Obstetrician Social research officer, government, Faculty Practice ?[facility]  ?

## 2021-06-16 NOTE — Telephone Encounter (Signed)
With the help of Esthefany the patient was called to inform of an appointment, there was no answer to the phone call so a voicemail was left with the call back number for the office.   ?

## 2021-06-17 ENCOUNTER — Other Ambulatory Visit: Payer: Self-pay

## 2021-06-17 LAB — CERVICOVAGINAL ANCILLARY ONLY
Bacterial Vaginitis (gardnerella): NEGATIVE
Candida Glabrata: NEGATIVE
Candida Vaginitis: NEGATIVE
Chlamydia: NEGATIVE
Comment: NEGATIVE
Comment: NEGATIVE
Comment: NEGATIVE
Comment: NEGATIVE
Comment: NEGATIVE
Comment: NORMAL
Neisseria Gonorrhea: NEGATIVE
Trichomonas: NEGATIVE

## 2021-06-18 LAB — CYTOLOGY - PAP
Chlamydia: NEGATIVE
Comment: NEGATIVE
Comment: NEGATIVE
Comment: NORMAL
Diagnosis: NEGATIVE
High risk HPV: NEGATIVE
Neisseria Gonorrhea: NEGATIVE

## 2021-06-20 ENCOUNTER — Other Ambulatory Visit: Payer: Self-pay

## 2021-06-23 ENCOUNTER — Ambulatory Visit
Admission: RE | Admit: 2021-06-23 | Discharge: 2021-06-23 | Disposition: A | Discharge status: Home / Self Care | Payer: Self-pay | Source: Ambulatory Visit | Attending: Obstetrics and Gynecology | Admitting: Obstetrics and Gynecology

## 2021-06-23 DIAGNOSIS — N920 Excessive and frequent menstruation with regular cycle: Secondary | ICD-10-CM | POA: Insufficient documentation

## 2021-06-23 DIAGNOSIS — D251 Intramural leiomyoma of uterus: Secondary | ICD-10-CM | POA: Insufficient documentation

## 2021-06-23 DIAGNOSIS — D252 Subserosal leiomyoma of uterus: Secondary | ICD-10-CM | POA: Insufficient documentation

## 2021-06-24 ENCOUNTER — Ambulatory Visit: Payer: Self-pay | Admitting: Critical Care Medicine

## 2021-06-30 ENCOUNTER — Ambulatory Visit: Payer: Self-pay

## 2021-07-07 ENCOUNTER — Ambulatory Visit: Payer: Self-pay | Admitting: Obstetrics and Gynecology

## 2021-08-04 ENCOUNTER — Ambulatory Visit: Payer: Self-pay | Attending: Critical Care Medicine | Admitting: Critical Care Medicine

## 2021-08-04 ENCOUNTER — Other Ambulatory Visit: Payer: Self-pay

## 2021-08-04 ENCOUNTER — Encounter: Payer: Self-pay | Admitting: Critical Care Medicine

## 2021-08-04 VITALS — BP 132/85 | HR 84 | Wt 153.6 lb

## 2021-08-04 DIAGNOSIS — N920 Excessive and frequent menstruation with regular cycle: Secondary | ICD-10-CM

## 2021-08-04 DIAGNOSIS — D509 Iron deficiency anemia, unspecified: Secondary | ICD-10-CM

## 2021-08-04 DIAGNOSIS — I10 Essential (primary) hypertension: Secondary | ICD-10-CM

## 2021-08-04 DIAGNOSIS — D251 Intramural leiomyoma of uterus: Secondary | ICD-10-CM

## 2021-08-04 DIAGNOSIS — D252 Subserosal leiomyoma of uterus: Secondary | ICD-10-CM

## 2021-08-04 DIAGNOSIS — D500 Iron deficiency anemia secondary to blood loss (chronic): Secondary | ICD-10-CM | POA: Insufficient documentation

## 2021-08-04 MED ORDER — AMLODIPINE BESYLATE 5 MG PO TABS
5.0000 mg | ORAL_TABLET | Freq: Every day | ORAL | 1 refills | Status: DC
  Filled 2021-08-04: qty 30, 30d supply, fill #0

## 2021-08-04 MED ORDER — FERROUS SULFATE 325 (65 FE) MG PO TABS
325.0000 mg | ORAL_TABLET | Freq: Three times a day (TID) | ORAL | 1 refills | Status: DC
  Filled 2021-08-04: qty 90, 30d supply, fill #0

## 2021-08-04 MED ORDER — TRANEXAMIC ACID 650 MG PO TABS
1300.0000 mg | ORAL_TABLET | Freq: Three times a day (TID) | ORAL | 4 refills | Status: DC
  Filled 2021-08-04: qty 30, 5d supply, fill #0

## 2021-08-04 NOTE — Progress Notes (Signed)
Established Patient Office Visit  Subjective   Patient ID: Tracey Gill, female    DOB: [birth year]  Age: 41 y.o. MRN: [id number]  PCP f/u   CC: pelvic pain  Derrek Monaco G3P3 LMP 68/40 is a 41 year old [place] speaking female with a history of menorrhagia and uterine myoma presents today with worsening pain and bleeding. Accompanied by her daughters and used as interpreter. Patient was referred to [facility] Gynecology and ultrasound revealed her myoma had increased in size from 5.1 cm (02/14/19) to 8.3 cm (06/23/21). She continually experiences periods that last between 6-10 days with excessive bleeding. During her periods she experiences dizziness and pain in her legs. She has tried using Lysteda to help with her menorrhagia however she states it really is not helping much. Denies chance of being pregnant and use of birth control.  Patient has scheduled uterine biopsy on  08/02/21.  Patient Active Problem List   Diagnosis Date Noted   Iron deficiency anemia due to chronic blood loss 08/04/2021   Menorrhagia 06/16/2021   Intramural and subserous leiomyoma of uterus 02/03/2021   Elevated LDL cholesterol level 10/01/2020   Essential hypertension 09/16/2020   BMI 27.0-27.9,adult 09/16/2020   Past Medical History:  Diagnosis Date   Anemia    Hypertension    Known health problems: none    Past Surgical History:  Procedure Laterality Date   removal of benign right breast mass Left    Social History   Tobacco Use   Smoking status: Never   Smokeless tobacco: Never  Vaping Use   Vaping Use: Never used  Substance Use Topics   Alcohol use: No   Drug use: No   Social History   Socioeconomic History   Marital status: Married    Spouse name: Not on file   Number of children: 3   Years of education: Not on file   Highest education level: Not on file  Occupational History   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  Tobacco Use   Smoking status: Never   Smokeless tobacco: Never   Vaping Use   Vaping Use: Never used  Substance and Sexual Activity   Alcohol use: No   Drug use: No   Sexual activity: Yes  Other Topics Concern   Not on file  Social History Narrative   Not on file   Social Determinants of Health   Financial Resource Strain: Not on file  Food Insecurity: Not on file  Transportation Needs: Not on file  Physical Activity: Not on file  Stress: Not on file  Social Connections: Not on file  Intimate Partner Violence: Not on file   Family Status  Relation Name Status   Mother  (Not Specified)   Father  (Not Specified)   Sister  (Not Specified)   Daughter  Alive   Family History  Problem Relation Age of Onset   Diabetes Mother    Diabetes Father    Heart disease Father    Diabetes Sister    No Known Allergies  Review of Systems  Constitutional:  Positive for malaise/fatigue. Negative for chills, diaphoresis, fever and weight loss.  HENT: Negative.  Negative for congestion, ear discharge, ear pain, hearing loss, nosebleeds, sore throat and tinnitus.   Eyes: Negative.  Negative for blurred vision, double vision, photophobia, discharge and redness.  Respiratory: Negative.  Negative for cough, hemoptysis, sputum production, shortness of breath, wheezing and stridor.        No excess mucus  Cardiovascular: Negative.  Negative for chest pain, palpitations, orthopnea, claudication, leg swelling and PND.  Gastrointestinal:  Positive for abdominal pain. Negative for blood in stool, constipation, diarrhea, heartburn, melena, nausea and vomiting.  Genitourinary: Negative.  Negative for dysuria, flank pain, frequency, hematuria and urgency.  Musculoskeletal:  Negative for back pain, falls, joint pain, myalgias and neck pain.  Skin: Negative.  Negative for itching and rash.  Neurological:  Positive for dizziness and weakness. Negative for tingling, tremors, sensory change, speech change, focal weakness, seizures, loss of consciousness and headaches.   Endo/Heme/Allergies:  Negative for environmental allergies and polydipsia. Does not bruise/bleed easily.  Psychiatric/Behavioral:  Negative for depression, hallucinations, memory loss, substance abuse and suicidal ideas. The patient is not nervous/anxious and does not have insomnia.   All other systems reviewed and are negative.     Objective:     BP 132/85   Pulse 84   Wt 153 lb 9.6 oz (69.7 kg)   SpO2 96%   BMI 29.57 kg/m  BP Readings from Last 3 Encounters:  08/04/21 132/85  06/16/21 (!) 151/96  05/14/21 (!) 152/92   Wt Readings from Last 3 Encounters:  08/04/21 153 lb 9.6 oz (69.7 kg)  06/16/21 153 lb 14.4 oz (69.8 kg)  05/14/21 156 lb 6.4 oz (70.9 kg)      Physical Exam Vitals reviewed.  Constitutional:      Appearance: Normal appearance. She is well-developed. She is obese. She is not diaphoretic.  HENT:     Head: Normocephalic and atraumatic.     Right Ear: Tympanic membrane normal.     Left Ear: Tympanic membrane normal.     Nose: No nasal deformity, septal deviation, mucosal edema or rhinorrhea.     Right Sinus: No maxillary sinus tenderness or frontal sinus tenderness.     Left Sinus: No maxillary sinus tenderness or frontal sinus tenderness.     Mouth/Throat:     Mouth: Mucous membranes are moist.     Pharynx: Oropharynx is clear. No oropharyngeal exudate.  Eyes:     General: No scleral icterus.    Conjunctiva/sclera: Conjunctivae normal.     Pupils: Pupils are equal, round, and reactive to light.  Neck:     Thyroid: No thyromegaly.     Vascular: No carotid bruit or JVD.     Trachea: Trachea normal. No tracheal tenderness or tracheal deviation.  Cardiovascular:     Rate and Rhythm: Normal rate and regular rhythm.     Chest Wall: PMI is not displaced.     Pulses: Normal pulses. No decreased pulses.     Heart sounds: Normal heart sounds, S1 normal and S2 normal. Heart sounds not distant. No murmur heard.    No systolic murmur is present.     No  diastolic murmur is present.     No friction rub. No gallop. No S3 or S4 sounds.  Pulmonary:     Effort: Pulmonary effort is normal. No tachypnea, accessory muscle usage or respiratory distress.     Breath sounds: Normal breath sounds. No stridor. No decreased breath sounds, wheezing, rhonchi or rales.  Chest:     Chest wall: No tenderness.  Abdominal:     General: Abdomen is flat. Bowel sounds are normal. There is no distension.     Palpations: Abdomen is soft. Abdomen is not rigid.     Tenderness: There is abdominal tenderness in the suprapubic area. There is no guarding or rebound.  Musculoskeletal:        General: Normal  range of motion.     Cervical back: Normal range of motion and neck supple. No edema, erythema or rigidity. No muscular tenderness. Normal range of motion.  Lymphadenopathy:     Head:     Right side of head: No submental or submandibular adenopathy.     Left side of head: No submental or submandibular adenopathy.     Cervical: No cervical adenopathy.  Skin:    General: Skin is warm and dry.     Coloration: Skin is not pale.     Findings: No rash.     Nails: There is no clubbing.  Neurological:     Mental Status: She is alert and oriented to person, place, and time.     Sensory: No sensory deficit.  Psychiatric:        Speech: Speech normal.        Behavior: Behavior normal.      No results found for any visits on 08/04/21.  Last CBC Lab Results  Component Value Date   WBC 7.0 05/14/2021   HGB 9.2 (L) 05/14/2021   HCT 30.6 (L) 05/14/2021   MCV 76 (L) 05/14/2021   MCH 22.8 (L) 05/14/2021   RDW 18.0 (H) 05/14/2021   PLT 356 85/46/2703   Last metabolic panel Lab Results  Component Value Date   GLUCOSE 89 05/14/2021   NA 139 05/14/2021   K 4.0 05/14/2021   CL 104 05/14/2021   CO2 24 05/14/2021   BUN 12 05/14/2021   CREATININE 0.61 05/14/2021   EGFR 117 05/14/2021   CALCIUM 8.5 (L) 05/14/2021   PROT 6.5 05/14/2021   ALBUMIN 4.2 05/14/2021    LABGLOB 2.3 05/14/2021   AGRATIO 1.8 05/14/2021   BILITOT <0.2 05/14/2021   ALKPHOS 66 05/14/2021   AST 12 05/14/2021   ALT 8 05/14/2021   ANIONGAP 7 12/22/2020   Last lipids Lab Results  Component Value Date   CHOL 198 09/30/2020   HDL 52 09/30/2020   LDLCALC 120 (H) 09/30/2020   TRIG 146 09/30/2020   CHOLHDL 3.8 09/30/2020   Last hemoglobin A1c No results found for: "HGBA1C" Last thyroid functions Lab Results  Component Value Date   TSH 1.480 05/14/2021   T4TOTAL 7.3 05/14/2021   Last vitamin D Lab Results  Component Value Date   VD25OH 21.7 (L) 01/06/2019      Assessment & Plan:   Problem List Items Addressed This Visit       Cardiovascular and Mediastinum   Essential hypertension    Blood Pressure 132/85. Well controlled. Continue amlodipine 5 mg daily.      Relevant Medications   tranexamic acid (LYSTEDA) 650 MG TABS tablet   amLODipine (NORVASC) 5 MG tablet     Genitourinary   Intramural and subserous leiomyoma of uterus    As per menorrhagia assessment        Other   Menorrhagia    Patient is still experiencing heavy menstrual cycles. Continue Lysteda during menstrual cycles until Biopsy results and consultation with gynecology.      Relevant Medications   tranexamic acid (LYSTEDA) 650 MG TABS tablet   Iron deficiency anemia due to chronic blood loss    Last complete blood count in March. Microcytic anemia present however stable with her condition. Continue Ferrous Sulfate Daily.  Referral sent for Iron Infusion. Will order labs at next visit.      Relevant Medications   ferrous sulfate 325 (65 FE) MG tablet   Other Visit Diagnoses  Iron deficiency anemia, unspecified iron deficiency anemia type       Relevant Medications   ferrous sulfate 325 (65 FE) MG tablet       Return in about 2 months (around 10/04/2021).    Asencion Noble, MD

## 2021-08-04 NOTE — Assessment & Plan Note (Signed)
Patient is still experiencing heavy menstrual cycles. Continue Lysteda during menstrual cycles until Biopsy results and consultation with gynecology.

## 2021-08-04 NOTE — Assessment & Plan Note (Signed)
Blood Pressure 132/85. Well controlled. Continue amlodipine 5 mg daily.

## 2021-08-04 NOTE — Patient Instructions (Addendum)
Stay on iron supplement and amlodipine  Stay on Lysteda for now during menstrual periods  Keep your July appointment for endometrial biopsy  We will arrange an iron intravenous infusion.  Someone will call you to sign paperwork to get patient assistance  Return Dr Joya Gaskins 2 months  Seguir tomando suplementos de hierro y Brewing technologist en Lysteda por ahora durante los perodos menstruales  Mantenga su cita de julio para la biopsia endometrial  Organizaremos una infusin intravenosa de hierro. Alguien lo llamar para firmar el papeleo para obtener asistencia del [place].  Volver Dr. Joya Gaskins 2 meses

## 2021-08-04 NOTE — Assessment & Plan Note (Signed)
Last complete blood count in March. Microcytic anemia present however stable with her condition. Continue Ferrous Sulfate Daily.  Referral sent for Iron Infusion. Will order labs at next visit.

## 2021-08-04 NOTE — Assessment & Plan Note (Signed)
As per menorrhagia assessment

## 2021-08-05 ENCOUNTER — Other Ambulatory Visit: Payer: Self-pay

## 2021-08-06 ENCOUNTER — Telehealth: Payer: Self-pay | Admitting: Pharmacy Technician

## 2021-08-06 NOTE — Telephone Encounter (Addendum)
Monoferric PAP - Free drug: Approved 08/07/21 - 08/08/22 ID: [id number]  Phone: [phone] Fax: [phone] Patient will be scheduled as soon as possible. Medication will be delivered 08/20/21

## 2021-08-07 ENCOUNTER — Other Ambulatory Visit: Payer: Self-pay

## 2021-08-12 ENCOUNTER — Ambulatory Visit (INDEPENDENT_AMBULATORY_CARE_PROVIDER_SITE_OTHER): Payer: Self-pay

## 2021-08-12 VITALS — BP 109/69 | HR 83 | Temp 98.5°F | Resp 18 | Ht 60.0 in | Wt 156.4 lb

## 2021-08-12 DIAGNOSIS — D500 Iron deficiency anemia secondary to blood loss (chronic): Secondary | ICD-10-CM

## 2021-08-12 DIAGNOSIS — D252 Subserosal leiomyoma of uterus: Secondary | ICD-10-CM

## 2021-08-12 DIAGNOSIS — D251 Intramural leiomyoma of uterus: Secondary | ICD-10-CM

## 2021-08-12 DIAGNOSIS — N920 Excessive and frequent menstruation with regular cycle: Secondary | ICD-10-CM

## 2021-08-12 MED ORDER — SODIUM CHLORIDE 0.9 % IV SOLN
1000.0000 mg | Freq: Once | INTRAVENOUS | Status: AC
  Administered 2021-08-12: 1000 mg via INTRAVENOUS
  Filled 2021-08-12: qty 10

## 2021-08-12 NOTE — Progress Notes (Signed)
Diagnosis: Iron Deficiency Anemia  Provider:  Marshell Garfinkel, MD  Procedure: Infusion  IV Type: Peripheral, IV Location: R Antecubital  Monoferric (Ferric Derisomaltose), Dose: 1000 mg  Infusion Start Time: 8588  Infusion Stop Time: 5027  Post Infusion IV Care: Observation period completed  Discharge: Condition: Good, Destination: Home . AVS provided to patient.   Performed by:  Paul Dykes, RN

## 2021-08-22 ENCOUNTER — Ambulatory Visit: Payer: Self-pay | Admitting: Obstetrics and Gynecology

## 2021-09-01 ENCOUNTER — Ambulatory Visit: Payer: Self-pay | Admitting: Obstetrics and Gynecology

## 2021-09-19 ENCOUNTER — Ambulatory Visit: Payer: Self-pay | Admitting: Obstetrics and Gynecology

## 2021-09-24 ENCOUNTER — Encounter: Payer: Self-pay | Admitting: Obstetrics and Gynecology

## 2021-09-24 ENCOUNTER — Ambulatory Visit (INDEPENDENT_AMBULATORY_CARE_PROVIDER_SITE_OTHER): Payer: Self-pay | Admitting: Obstetrics and Gynecology

## 2021-09-24 ENCOUNTER — Other Ambulatory Visit (HOSPITAL_COMMUNITY)
Admission: RE | Admit: 2021-09-24 | Discharge: 2021-09-24 | Disposition: A | Discharge status: Home / Self Care | Payer: Self-pay | Source: Ambulatory Visit | Attending: Obstetrics and Gynecology | Admitting: Obstetrics and Gynecology

## 2021-09-24 VITALS — BP 134/82 | HR 83 | Wt 155.5 lb

## 2021-09-24 DIAGNOSIS — D252 Subserosal leiomyoma of uterus: Secondary | ICD-10-CM | POA: Insufficient documentation

## 2021-09-24 DIAGNOSIS — N921 Excessive and frequent menstruation with irregular cycle: Secondary | ICD-10-CM | POA: Insufficient documentation

## 2021-09-24 DIAGNOSIS — D251 Intramural leiomyoma of uterus: Secondary | ICD-10-CM | POA: Insufficient documentation

## 2021-09-24 MED ORDER — IBUPROFEN 800 MG PO TABS
800.0000 mg | ORAL_TABLET | Freq: Once | ORAL | Status: AC
  Administered 2021-09-24: 800 mg via ORAL

## 2021-09-24 NOTE — Progress Notes (Signed)
ENDOMETRIAL BIOPSY      Tracey Gill is a 41 y.o. [id number] here for endometrial biopsy.  The indications for endometrial biopsy were reviewed.  Risks of the biopsy including cramping, bleeding, infection, uterine perforation, inadequate specimen and need for additional procedures were discussed. The patient states she understands and agrees to undergo procedure today. Consent was signed. Time out was performed.   Indications: menorrhagia Urine HCG: negative  A bivalve speculum was placed into the vagina and the cervix was easily visualized and was prepped with Betadine x2. A single-toothed tenaculum was placed on the anterior lip of the cervix to stabilize it. The 3 mm pipelle was introduced into the endometrial cavity without difficulty to a depth of 13 cm, and a moderate amount of tissue was obtained and sent to pathology. This was repeated for a total of 3 passes. The instruments were removed from the patient's vagina. Minimal bleeding from the cervix at the tenaculum was noted.   The patient tolerated the procedure well. Routine post-procedure instructions were given to the patient.    Will base further management on results of biopsy.    Lynnda Shields, MD Faculty attending, Center for [facility]

## 2021-09-24 NOTE — Progress Notes (Signed)
  CC: heavy bleeding Subjective:    Patient ID: Tracey Gill, female    DOB: March 07, 1980, 41 y.o.   MRN: [id number]  HPI Pt seen reviewed ultrasound findings and discussed treatment options.  Also discussed endometrial biopsy procedure, see separate note.   Review of Systems     Objective:   Physical Exam Vitals:   09/24/21 1507  BP: 134/82  Pulse: 83   CLINICAL DATA:  Initial evaluation for fibroid uterus.   EXAM: TRANSABDOMINAL AND TRANSVAGINAL ULTRASOUND OF PELVIS   TECHNIQUE: Both transabdominal and transvaginal ultrasound examinations of the pelvis were performed. Transabdominal technique was performed for global imaging of the pelvis including uterus, ovaries, adnexal regions, and pelvic cul-de-sac. It was necessary to proceed with endovaginal exam following the transabdominal exam to visualize the uterus and endometrium.   COMPARISON:  Ultrasound from 01/28/2020.   FINDINGS: Uterus   Measurements: 13.8 x 10.2 x 11.4 cm = volume: 842.6 mL. Uterus is anteverted. Previously identified intramural fibroid at the left uterine body is increased in size, now measuring 8.3 x 7.2 x 7.8 cm (previously 4.7 x 5.1 x 4.7 cm). Question additional intramural to subserosal fibroid at the posterior uterine body measuring 5.6 x 3.2 x 3.9 cm, not definitely seen on previous.   Endometrium   Thickness: 8.8 mm.  No focal abnormality visualized.   Right ovary   Not visualized.  No adnexal mass.   Left ovary   Not visualized.  No adnexal mass.   Other findings   No abnormal free fluid.   IMPRESSION: 1. 8.3 cm intramural fibroid at the left uterine body, increased in size as compared to most recent ultrasound from 02/14/2019 (previously measuring up to 5.1 cm). 2. Question additional 5.6 cm intramural to subserosal fibroid at the posterior uterine body, not definitely seen on previous. 3. Normal endometrium. 4. Nonvisualization of either ovary. No adnexal mass  or free fluid within the pelvis.      Assessment & Plan:   1. Menorrhagia with irregular cycle See below - Surgical pathology( [place]) - Ambulatory referral to Interventional Radiology  2. Intramural and subserous leiomyoma of uterus Discussed treatment options with patient including    1.  Fibroid embolization     2. Myfembree    3. Son[place] procedure    4. Hysterectomy (final option) Pt desires UFE, referral placed Pt will need [facility] financial agreement  If intermediary treatment is ineffective, consider hysterectomy - Surgical pathology( [place]) - Ambulatory referral to Interventional Radiology  I spent 10 minutes dedicated to the care of this patient including previsit review of records, face to face time with the patient discussing treatment options, biopsy procedure and post visit testing.     Griffin Basil, MD Faculty Attending, Center for [facility]

## 2021-09-26 LAB — SURGICAL PATHOLOGY

## 2021-10-05 NOTE — Progress Notes (Deleted)
Established Patient Office Visit  Subjective   Patient ID: Tracey Gill, female    DOB: Jul 15, 1980  Age: 41 y.o. MRN: [id number]  PCP f/u   CC: pelvic pain  08/02/21 Derrek Monaco G3P3 LMP 22/17 is a 41 year old [place] speaking female with a history of menorrhagia and uterine myoma presents today with worsening pain and bleeding. Accompanied by her daughters and used as interpreter. Patient was referred to [facility] and ultrasound revealed her myoma had increased in size from 5.1 cm (02/14/19) to 8.3 cm (06/23/21). She continually experiences periods that last between 6-10 days with excessive bleeding. During her periods she experiences dizziness and pain in her legs. She has tried using Lysteda to help with her menorrhagia however she states it really is not helping much. Denies chance of being pregnant and use of birth control.  Patient has scheduled uterine biopsy on  08/02/21.   8/21 Had uterine Bx per GYN 09/24/21     Cardiovascular and Mediastinum   Essential hypertension    Blood Pressure 132/85. Well controlled. Continue amlodipine 5 mg daily.      Relevant Medications   tranexamic acid (LYSTEDA) 650 MG TABS tablet   amLODipine (NORVASC) 5 MG tablet     Genitourinary   Intramural and subserous leiomyoma of uterus    As per menorrhagia assessment        Other   Menorrhagia    Patient is still experiencing heavy menstrual cycles. Continue Lysteda during menstrual cycles until Biopsy results and consultation with gynecology.      Relevant Medications   tranexamic acid (LYSTEDA) 650 MG TABS tablet   Iron deficiency anemia due to chronic blood loss    Last complete blood count in March. Microcytic anemia present however stable with her condition. Continue Ferrous Sulfate Daily.  Referral sent for Iron Infusion. Will order labs at next visit.      Relevant Medications   ferrous sulfate 325 (65 FE) MG tablet   Other Visit Diagnoses     Iron  deficiency anemia, unspecified iron deficiency anemia type       Relevant Medications   ferrous sulfate 325 (65 FE) MG tablet   Patient Active Problem List   Diagnosis Date Noted  . Iron deficiency anemia due to chronic blood loss 08/04/2021  . Menorrhagia 06/16/2021  . Intramural and subserous leiomyoma of uterus 02/03/2021  . Elevated LDL cholesterol level 10/01/2020  . Essential hypertension 09/16/2020  . BMI 27.0-27.9,adult 09/16/2020   Past Medical History:  Diagnosis Date  . Anemia   . Hypertension   . Known health problems: none    Past Surgical History:  Procedure Laterality Date  . removal of benign right breast mass Left    Social History   Tobacco Use  . Smoking status: Never  . Smokeless tobacco: Never  Vaping Use  . Vaping Use: Never used  Substance Use Topics  . Alcohol use: No  . Drug use: No   Social History   Socioeconomic History  . Marital status: Married    Spouse name: Not on file  . Number of children: 3  . Years of education: Not on file  . Highest education level: Not on file  Occupational History  . 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 Smoking status: Never  . Smokeless tobacco: Never  Vaping Use  . Vaping Use: Never used  Substance and Sexual Activity  . Alcohol use: No  . Drug use: No  .  Sexual activity: Yes  Other Topics Concern  . Not on file  Social History Narrative  . Not on file   Social Determinants of Health   Financial Resource Strain: Not on file  Food Insecurity: Not on file  Transportation Needs: Not on file  Physical Activity: Not on file  Stress: Not on file  Social Connections: Not on file  Intimate Partner Violence: Not on file   Family Status  Relation Name Status  . Mother  (Not Specified)  . Father  (Not Specified)  . Sister  (Not Specified)  . Daughter  Alive   Family History  Problem Relation Age of Onset  . Diabetes Mother   . Diabetes Father   . Heart disease Father   . Diabetes  Sister    No Known Allergies  Review of Systems  Constitutional:  Positive for malaise/fatigue. Negative for chills, diaphoresis, fever and weight loss.  HENT: Negative.  Negative for congestion, ear discharge, ear pain, hearing loss, nosebleeds, sore throat and tinnitus.   Eyes: Negative.  Negative for blurred vision, double vision, photophobia, discharge and redness.  Respiratory: Negative.  Negative for cough, hemoptysis, sputum production, shortness of breath, wheezing and stridor.        No excess mucus  Cardiovascular: Negative.  Negative for chest pain, palpitations, orthopnea, claudication, leg swelling and PND.  Gastrointestinal:  Positive for abdominal pain. Negative for blood in stool, constipation, diarrhea, heartburn, melena, nausea and vomiting.  Genitourinary: Negative.  Negative for dysuria, flank pain, frequency, hematuria and urgency.  Musculoskeletal:  Negative for back pain, falls, joint pain, myalgias and neck pain.  Skin: Negative.  Negative for itching and rash.  Neurological:  Positive for dizziness and weakness. Negative for tingling, tremors, sensory change, speech change, focal weakness, seizures, loss of consciousness and headaches.  Endo/Heme/Allergies:  Negative for environmental allergies and polydipsia. Does not bruise/bleed easily.  Psychiatric/Behavioral:  Negative for depression, hallucinations, memory loss, substance abuse and suicidal ideas. The patient is not nervous/anxious and does not have insomnia.   All other systems reviewed and are negative.     Objective:     There were no vitals taken for this visit. BP Readings from Last 3 Encounters:  09/24/21 134/82  08/12/21 109/69  08/04/21 132/85   Wt Readings from Last 3 Encounters:  09/24/21 155 lb 8 oz (70.5 kg)  08/12/21 156 lb 6.4 oz (70.9 kg)  08/04/21 153 lb 9.6 oz (69.7 kg)      Physical Exam Vitals reviewed.  Constitutional:      Appearance: Normal appearance. She is  well-developed. She is obese. She is not diaphoretic.  HENT:     Head: Normocephalic and atraumatic.     Right Ear: Tympanic membrane normal.     Left Ear: Tympanic membrane normal.     Nose: No nasal deformity, septal deviation, mucosal edema or rhinorrhea.     Right Sinus: No maxillary sinus tenderness or frontal sinus tenderness.     Left Sinus: No maxillary sinus tenderness or frontal sinus tenderness.     Mouth/Throat:     Mouth: Mucous membranes are moist.     Pharynx: Oropharynx is clear. No oropharyngeal exudate.  Eyes:     General: No scleral icterus.    Conjunctiva/sclera: Conjunctivae normal.     Pupils: Pupils are equal, round, and reactive to light.  Neck:     Thyroid: No thyromegaly.     Vascular: No carotid bruit or JVD.     Trachea: Trachea normal.  No tracheal tenderness or tracheal deviation.  Cardiovascular:     Rate and Rhythm: Normal rate and regular rhythm.     Chest Wall: PMI is not displaced.     Pulses: Normal pulses. No decreased pulses.     Heart sounds: Normal heart sounds, S1 normal and S2 normal. Heart sounds not distant. No murmur heard.    No systolic murmur is present.     No diastolic murmur is present.     No friction rub. No gallop. No S3 or S4 sounds.  Pulmonary:     Effort: Pulmonary effort is normal. No tachypnea, accessory muscle usage or respiratory distress.     Breath sounds: Normal breath sounds. No stridor. No decreased breath sounds, wheezing, rhonchi or rales.  Chest:     Chest wall: No tenderness.  Abdominal:     General: Abdomen is flat. Bowel sounds are normal. There is no distension.     Palpations: Abdomen is soft. Abdomen is not rigid.     Tenderness: There is abdominal tenderness in the suprapubic area. There is no guarding or rebound.  Musculoskeletal:        General: Normal range of motion.     Cervical back: Normal range of motion and neck supple. No edema, erythema or rigidity. No muscular tenderness. Normal range of  motion.  Lymphadenopathy:     Head:     Right side of head: No submental or submandibular adenopathy.     Left side of head: No submental or submandibular adenopathy.     Cervical: No cervical adenopathy.  Skin:    General: Skin is warm and dry.     Coloration: Skin is not pale.     Findings: No rash.     Nails: There is no clubbing.  Neurological:     Mental Status: She is alert and oriented to person, place, and time.     Sensory: No sensory deficit.  Psychiatric:        Speech: Speech normal.        Behavior: Behavior normal.     No results found for any visits on 10/06/21.  Last CBC Lab Results  Component Value Date   WBC 7.0 05/14/2021   HGB 9.2 (L) 05/14/2021   HCT 30.6 (L) 05/14/2021   MCV 76 (L) 05/14/2021   MCH 22.8 (L) 05/14/2021   RDW 18.0 (H) 05/14/2021   PLT 356 95/62/1308   Last metabolic panel Lab Results  Component Value Date   GLUCOSE 89 05/14/2021   NA 139 05/14/2021   K 4.0 05/14/2021   CL 104 05/14/2021   CO2 24 05/14/2021   BUN 12 05/14/2021   CREATININE 0.61 05/14/2021   EGFR 117 05/14/2021   CALCIUM 8.5 (L) 05/14/2021   PROT 6.5 05/14/2021   ALBUMIN 4.2 05/14/2021   LABGLOB 2.3 05/14/2021   AGRATIO 1.8 05/14/2021   BILITOT <0.2 05/14/2021   ALKPHOS 66 05/14/2021   AST 12 05/14/2021   ALT 8 05/14/2021   ANIONGAP 7 12/22/2020   Last lipids Lab Results  Component Value Date   CHOL 198 09/30/2020   HDL 52 09/30/2020   LDLCALC 120 (H) 09/30/2020   TRIG 146 09/30/2020   CHOLHDL 3.8 09/30/2020   Last hemoglobin A1c No results found for: "HGBA1C" Last thyroid functions Lab Results  Component Value Date   TSH 1.480 05/14/2021   T4TOTAL 7.3 05/14/2021   Last vitamin D Lab Results  Component Value Date   VD25OH 21.7 (L) 01/06/2019  Assessment & Plan:   Problem List Items Addressed This Visit   None  No follow-ups on file.    Asencion Noble, MD

## 2021-10-06 ENCOUNTER — Ambulatory Visit: Payer: Self-pay | Admitting: Critical Care Medicine

## 2021-11-11 ENCOUNTER — Ambulatory Visit
Admission: EM | Admit: 2021-11-11 | Discharge: 2021-11-11 | Disposition: A | Discharge status: Home / Self Care | Payer: Self-pay

## 2021-11-11 ENCOUNTER — Ambulatory Visit: Payer: Self-pay | Admitting: *Deleted

## 2021-11-11 DIAGNOSIS — S61210A Laceration without foreign body of right index finger without damage to nail, initial encounter: Secondary | ICD-10-CM

## 2021-11-11 DIAGNOSIS — M79644 Pain in right finger(s): Secondary | ICD-10-CM

## 2021-11-11 NOTE — ED Triage Notes (Signed)
Pt presents with laceration to her pointer finger on her right hand. Reports cutting it on a can. Bleeding is controlled at this time.

## 2021-11-11 NOTE — Telephone Encounter (Signed)
  Chief Complaint: cut finger opening a can of milk , bleeding not stopping  Symptoms: cut finger at "crease" bend of finger. Continues to bleed after 5 minutes  Frequency: today  Pertinent Negatives: Patient denies na Disposition: '[]'$ ED /'[x]'$ Urgent Care (no appt availability in office) / '[]'$ Appointment(In office/virtual)/ '[]'$  [facility]/ '[]'$ Home Care/ '[]'$ Refused Recommended Disposition /'[]'$ [facility]/ '[]'$  Follow-up with PCP Additional Notes:   Recommended to go to UC due to non stop bleeding to finger.     Reason for Disposition  [1] Bleeding AND [2] won't stop after 10 minutes of direct pressure (using correct technique)    Go to ED/UC  Answer Assessment - Initial Assessment Questions 1. APPEARANCE of INJURY: "What does the injury look like?"      Cut finger on can of milk 2. SIZE: "How large is the cut?"      Cut finger at "crease " 3. BLEEDING: "Is it bleeding now?" If Yes, ask: "Is it difficult to stop?"      Yes will not stop  4. LOCATION: "Where is the injury located?"      Finger did not report which finger  5. ONSET: "How long ago did the injury occur?"      Na  6. MECHANISM: "Tell me how it happened."      Opening a can of milk and sliced finger on can  7. TETANUS: "When was the last tetanus booster?"     Due 02/04/2031 8. PREGNANCY: "Is there any chance you are pregnant?" "When was your last menstrual period?"     na  Protocols used: Cuts and Lacerations-A-AH

## 2021-11-11 NOTE — ED Provider Notes (Signed)
[facility]  Note:  This document was prepared using Systems analyst and may include unintentional dictation errors.  MRN: [id number] DOB: October 23, 1980  Subjective:   Tracey Gill is a 41 y.o. female presenting for suffering a right index finger laceration. Patient was trying to open a can.  Unfortunately slipped and cut her finger.  Tdap is up-to-date.  She has had profuse bleeding and has not been able to get it to stop.  Came straight to our clinic.  No current facility-administered medications for this encounter.  Current Outpatient Medications:    amLODipine (NORVASC) 5 MG tablet, Take 1 tablet (5 mg total) by mouth daily. To lower blood pressure, Disp: 90 tablet, Rfl: 1   ferrous sulfate 325 (65 FE) MG tablet, Take 1 tablet (325 mg total) by mouth 3 (three) times daily with meals., Disp: 90 tablet, Rfl: 1   neomycin-polymyxin b-dexamethasone (MAXITROL) 3.5-10000-0.1 OINT, SMARTSIG:1 Inch(es) In Eye(s) Every Night, Disp: , Rfl:    prednisoLONE acetate (PRED FORTE) 1 % ophthalmic suspension, SMARTSIG:In Eye(s), Disp: , Rfl:    tranexamic acid (LYSTEDA) 650 MG TABS tablet, Take 2 tablets (1,300 mg total) by mouth 3 (three) times daily. Take during menses for a maximum of five days, Disp: 30 tablet, Rfl: 4   No Known Allergies  Past Medical History:  Diagnosis Date   Anemia    Hypertension    Known health problems: none      Past Surgical History:  Procedure Laterality Date   removal of benign right breast mass Left     Family History  Problem Relation Age of Onset   Diabetes Mother    Diabetes Father    Heart disease Father    Diabetes Sister     Social History   Tobacco Use   Smoking status: Never   Smokeless tobacco: Never  Vaping Use   Vaping Use: Never used  Substance Use Topics   Alcohol use: No   Drug use: No    ROS   Objective:   Vitals: BP 138/82   Pulse 94   Temp 98 F (36.7 C)   Resp 18    LMP 11/11/2021   SpO2 98%   Physical Exam Constitutional:      General: She is not in acute distress.    Appearance: Normal appearance. She is well-developed. She is not ill-appearing, toxic-appearing or diaphoretic.  HENT:     Head: Normocephalic and atraumatic.     Nose: Nose normal.     Mouth/Throat:     Mouth: Mucous membranes are moist.  Eyes:     General: No scleral icterus.       Right eye: No discharge.        Left eye: No discharge.     Extraocular Movements: Extraocular movements intact.  Cardiovascular:     Rate and Rhythm: Normal rate.  Pulmonary:     Effort: Pulmonary effort is normal.  Musculoskeletal:       Hands:  Skin:    General: Skin is warm and dry.  Neurological:     General: No focal deficit present.     Mental Status: She is alert and oriented to person, place, and time.  Psychiatric:        Mood and Affect: Mood normal.        Behavior: Behavior normal.    PROCEDURE NOTE: laceration repair Verbal consent obtained from patient.  Local anesthesia with 3cc Lidocaine 1% without epinephrine.  Wound  explored for tendon, ligament damage. Wound scrubbed with soap and water and rinsed. Wound closed with #5 5-0 Prolene (simple interrupted) sutures.  Full range of motion prior to and after laceration repair.  Wound cleansed and dressed.  Assessment and Plan :   PDMP not reviewed this encounter.  1. Laceration of right index finger without foreign body without damage to nail, initial encounter   2. Finger pain, right    Laceration repaired successfully. Wound care reviewed. Recommended Tylenol and/or ibuprofen for pain control. Return-to-clinic precautions discussed, patient verbalized understanding. Otherwise, follow up in 10 days for suture removal. Counseled patient on potential for adverse effects with medications prescribed/recommended today, ER and return-to-clinic precautions discussed, patient verbalized understanding.    Jaynee Eagles, [place] 11/11/21  [id number]

## 2021-11-11 NOTE — Discharge Instructions (Signed)

## 2021-11-13 ENCOUNTER — Ambulatory Visit: Payer: Self-pay

## 2021-11-13 NOTE — Telephone Encounter (Signed)
    Chief Complaint: SOB  Symptoms: Sob with exertion Frequency: 1 week Pertinent Negatives: Patient denies Fever, chest pain Disposition: '[]'$ ED /'[]'$ Urgent Care (no appt availability in office) / '[]'$ Appointment(In office/virtual)/ '[]'$  [facility]/ '[]'$ Home Care/ '[]'$ Refused Recommended Disposition /'[x]'$ [facility]/ '[]'$  Follow-up with PCP Additional Notes: PT states that she has no other s/s other than SOB with exertion. Pt states that SOB is increasing.   Reason for Disposition  [1] MILD difficulty breathing (e.g., minimal/no SOB at rest, SOB with walking, pulse <100) AND [2] NEW-onset or WORSE than normal  Answer Assessment - Initial Assessment Questions 1. RESPIRATORY STATUS: "Describe your breathing?" (e.g., wheezing, shortness of breath, unable to speak, severe coughing)      When she starts to do things 2. ONSET: "When did this breathing problem begin?"      Last week 3. PATTERN "Does the difficult breathing come and go, or has it been constant since it started?"      Comes and goes 4. SEVERITY: "How bad is your breathing?" (e.g., mild, moderate, severe)    - MILD: No SOB at rest, mild SOB with walking, speaks normally in sentences, can lie down, no retractions, pulse < 100.    - MODERATE: SOB at rest, SOB with minimal exertion and prefers to sit, cannot lie down flat, speaks in phrases, mild retractions, audible wheezing, pulse 100-120.    - SEVERE: Very SOB at rest, speaks in single words, struggling to breathe, sitting hunched forward, retractions, pulse > 120      Moderate 5. RECURRENT SYMPTOM: "Have you had difficulty breathing before?" If Yes, ask: "When was the last time?" and "What happened that time?"      no 6. CARDIAC HISTORY: "Do you have any history of heart disease?" (e.g., heart attack, angina, bypass surgery, angioplasty)      No - HTN 7. LUNG HISTORY: "Do you have any history of lung disease?"  (e.g., pulmonary embolus, asthma, emphysema)     no 8.  CAUSE: "What do you think is causing the breathing problem?"      no 9. OTHER SYMPTOMS: "Do you have any other symptoms? (e.g., dizziness, runny nose, cough, chest pain, fever)     dizziness 10. O2 SATURATION MONITOR:  "Do you use an oxygen saturation monitor (pulse oximeter) at home?" If Yes, ask: "What is your reading (oxygen level) today?" "What is your usual oxygen saturation reading?" (e.g., 95%)        11. PREGNANCY: "Is there any chance you are pregnant?" "When was your last menstrual period?"       no 12. TRAVEL: "Have you traveled out of the country in the last month?" (e.g., travel history, exposures)       no  Protocols used: Breathing Difficulty-A-AH

## 2021-11-13 NOTE — Telephone Encounter (Signed)
Advised patient to take Covid test. Advised patient to go to UC or ED as well if she feels worse.  No acute distress noted while speaking to patient on the phone. Patient able to speak in full sentences. States she just feels more tired and winded than normal (taking clothes out of the dryer). She inquired about the mobile unit and was advised to call office back since schedule was not out to advise location.  Education administrator by [employer],  [place] # [phone]

## 2021-11-21 ENCOUNTER — Ambulatory Visit
Admission: EM | Admit: 2021-11-21 | Discharge: 2021-11-21 | Disposition: A | Discharge status: Home / Self Care | Payer: Self-pay | Attending: Emergency Medicine | Admitting: Emergency Medicine

## 2021-11-21 NOTE — Discharge Instructions (Signed)
Please monitor for signs of infection (drainage, odor, redness, and swelling).  If you notice any of these symptoms or have complications to the area please return for re-evaluation.

## 2021-11-21 NOTE — ED Triage Notes (Signed)
Pt presents to urgent care for suture removal to right index finger. The patient denies having any complications to the area.

## 2021-11-21 NOTE — ED Notes (Signed)
Patient speaks english and is able to have conversation with staff member.

## 2021-11-21 NOTE — ED Notes (Signed)
Four visible sutures removed successfully.

## 2021-12-22 NOTE — Addendum Note (Signed)
Addended by: Tresa Res on: 12/22/2021 09:39 AM   Modules accepted: Orders

## 2022-01-11 NOTE — Progress Notes (Signed)
Established Patient Office Visit  Subjective   Patient ID: Tracey Gill, female    DOB: [birth year]  Age: 41 y.o. MRN: [id number]  PCP f/u   CC: pelvic pain  07/2021 Derrek Monaco G3P3 LMP 32/28 is a 41 year old [place] speaking female with a history of menorrhagia and uterine myoma presents today with worsening pain and bleeding. Accompanied by her daughters and used as interpreter. Patient was referred to [facility] Gynecology and ultrasound revealed her myoma had increased in size from 5.1 cm (02/14/19) to 8.3 cm (06/23/21). She continually experiences periods that last between 6-10 days with excessive bleeding. During her periods she experiences dizziness and pain in her legs. She has tried using Lysteda to help with her menorrhagia however she states it really is not helping much. Denies chance of being pregnant and use of birth control.  Patient has scheduled uterine biopsy on  08/02/21.   11/28 this visit was assisted by Spanish interpreter Blair Heys [phone] Patient is seen today as a follow-up.  History of hypertension on arrival blood pressure is quite elevated 162/96 she is somewhat distressed over bilateral knee pain which is worsened and some swelling in the feet.  She still having excess bleeding and was seen by gynecology they were going to schedule her for a uterine ablation it is hard for me to tell if that was done but she wishes now to have a hysterectomy.  She had stopped the transects Amick acid as it was not helping the bleeding during periods  There are no other complaints.  She is asking for an iron infusion. Patient declines a flu vaccine  Patient Active Problem List   Diagnosis Date Noted   Chronic pain of both knees 01/13/2022   Iron deficiency anemia due to chronic blood loss 08/04/2021   Menorrhagia 06/16/2021   Intramural and subserous leiomyoma of uterus 02/03/2021   Elevated LDL cholesterol level 10/01/2020   Essential hypertension 09/16/2020    BMI 27.0-27.9,adult 09/16/2020   Past Medical History:  Diagnosis Date   Anemia    Hypertension    Known health problems: none    Past Surgical History:  Procedure Laterality Date   removal of benign right breast mass Left    Social History   Tobacco Use   Smoking status: Never   Smokeless tobacco: Never  Vaping Use   Vaping Use: Never used  Substance Use Topics   Alcohol use: No   Drug use: No   Social History   Socioeconomic History   Marital status: Married    Spouse name: Not on file   Number of children: 3   Years of education: Not on file   Highest education level: Not on file  Occupational History   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  Tobacco Use   Smoking status: Never   Smokeless tobacco: Never  Vaping Use   Vaping Use: Never used  Substance and Sexual Activity   Alcohol use: No   Drug use: No   Sexual activity: Yes  Other Topics Concern   Not on file  Social History Narrative   Not on file   Social Determinants of Health   Financial Resource Strain: Not on file  Food Insecurity: Not on file  Transportation Needs: Not on file  Physical Activity: Not on file  Stress: Not on file  Social Connections: Not on file  Intimate Partner Violence: Not on file   Family Status  Relation Name Status   Mother  (Not Specified)  Father  (Not Specified)   Sister  (Not Specified)   Daughter  Alive   Family History  Problem Relation Age of Onset   Diabetes Mother    Diabetes Father    Heart disease Father    Diabetes Sister    No Known Allergies  Review of Systems  Constitutional:  Negative for chills, diaphoresis, fever, malaise/fatigue and weight loss.  HENT: Negative.  Negative for congestion, ear discharge, ear pain, hearing loss, nosebleeds, sore throat and tinnitus.   Eyes: Negative.  Negative for blurred vision, double vision, photophobia, discharge and redness.  Respiratory: Negative.  Negative for cough, hemoptysis, sputum production, shortness of  breath, wheezing and stridor.        No excess mucus  Cardiovascular: Negative.  Negative for chest pain, palpitations, orthopnea, claudication, leg swelling and PND.  Gastrointestinal:  Positive for abdominal pain. Negative for blood in stool, constipation, diarrhea, heartburn, melena, nausea and vomiting.  Genitourinary: Negative.  Negative for dysuria, flank pain, frequency, hematuria and urgency.  Musculoskeletal:  Negative for back pain, falls, joint pain, myalgias and neck pain.  Skin: Negative.  Negative for itching and rash.  Neurological:  Positive for dizziness and weakness. Negative for tingling, tremors, sensory change, speech change, focal weakness, seizures, loss of consciousness and headaches.  Endo/Heme/Allergies:  Negative for environmental allergies and polydipsia. Does not bruise/bleed easily.  Psychiatric/Behavioral:  Negative for depression, hallucinations, memory loss, substance abuse and suicidal ideas. The patient is not nervous/anxious and does not have insomnia.   All other systems reviewed and are negative.     Objective:     BP (!) 162/96   Pulse 86   Wt 153 lb 9.6 oz (69.7 kg)   SpO2 100%   BMI 30.00 kg/m  BP Readings from Last 3 Encounters:  01/13/22 (!) 162/96  11/11/21 138/82  09/24/21 134/82   Wt Readings from Last 3 Encounters:  01/13/22 153 lb 9.6 oz (69.7 kg)  09/24/21 155 lb 8 oz (70.5 kg)  08/12/21 156 lb 6.4 oz (70.9 kg)      Physical Exam Vitals reviewed.  Constitutional:      Appearance: Normal appearance. She is well-developed. She is obese. She is not diaphoretic.  HENT:     Head: Normocephalic and atraumatic.     Right Ear: Tympanic membrane normal.     Left Ear: Tympanic membrane normal.     Nose: No nasal deformity, septal deviation, mucosal edema or rhinorrhea.     Right Sinus: No maxillary sinus tenderness or frontal sinus tenderness.     Left Sinus: No maxillary sinus tenderness or frontal sinus tenderness.      Mouth/Throat:     Mouth: Mucous membranes are moist.     Pharynx: Oropharynx is clear. No oropharyngeal exudate.  Eyes:     General: No scleral icterus.    Conjunctiva/sclera: Conjunctivae normal.     Pupils: Pupils are equal, round, and reactive to light.  Neck:     Thyroid: No thyromegaly.     Vascular: No carotid bruit or JVD.     Trachea: Trachea normal. No tracheal tenderness or tracheal deviation.  Cardiovascular:     Rate and Rhythm: Normal rate and regular rhythm.     Chest Wall: PMI is not displaced.     Pulses: Normal pulses. No decreased pulses.     Heart sounds: Normal heart sounds, S1 normal and S2 normal. Heart sounds not distant. No murmur heard.    No systolic murmur is present.  No diastolic murmur is present.     No friction rub. No gallop. No S3 or S4 sounds.  Pulmonary:     Effort: Pulmonary effort is normal. No tachypnea, accessory muscle usage or respiratory distress.     Breath sounds: Normal breath sounds. No stridor. No decreased breath sounds, wheezing, rhonchi or rales.  Chest:     Chest wall: No tenderness.  Abdominal:     General: Abdomen is flat. Bowel sounds are normal. There is no distension.     Palpations: Abdomen is soft. Abdomen is not rigid.     Tenderness: There is abdominal tenderness in the suprapubic area. There is no guarding or rebound.  Musculoskeletal:        General: Normal range of motion.     Cervical back: Normal range of motion and neck supple. No edema, erythema or rigidity. No muscular tenderness. Normal range of motion.  Lymphadenopathy:     Head:     Right side of head: No submental or submandibular adenopathy.     Left side of head: No submental or submandibular adenopathy.     Cervical: No cervical adenopathy.  Skin:    General: Skin is warm and dry.     Coloration: Skin is not pale.     Findings: No rash.     Nails: There is no clubbing.  Neurological:     Mental Status: She is alert and oriented to person, place,  and time.     Sensory: No sensory deficit.  Psychiatric:        Speech: Speech normal.        Behavior: Behavior normal.      No results found for any visits on 01/13/22.  Last CBC Lab Results  Component Value Date   WBC 7.0 05/14/2021   HGB 9.2 (L) 05/14/2021   HCT 30.6 (L) 05/14/2021   MCV 76 (L) 05/14/2021   MCH 22.8 (L) 05/14/2021   RDW 18.0 (H) 05/14/2021   PLT 356 75/44/9201   Last metabolic panel Lab Results  Component Value Date   GLUCOSE 89 05/14/2021   NA 139 05/14/2021   K 4.0 05/14/2021   CL 104 05/14/2021   CO2 24 05/14/2021   BUN 12 05/14/2021   CREATININE 0.61 05/14/2021   EGFR 117 05/14/2021   CALCIUM 8.5 (L) 05/14/2021   PROT 6.5 05/14/2021   ALBUMIN 4.2 05/14/2021   LABGLOB 2.3 05/14/2021   AGRATIO 1.8 05/14/2021   BILITOT <0.2 05/14/2021   ALKPHOS 66 05/14/2021   AST 12 05/14/2021   ALT 8 05/14/2021   ANIONGAP 7 12/22/2020   Last lipids Lab Results  Component Value Date   CHOL 198 09/30/2020   HDL 52 09/30/2020   LDLCALC 120 (H) 09/30/2020   TRIG 146 09/30/2020   CHOLHDL 3.8 09/30/2020   Last hemoglobin A1c No results found for: "HGBA1C" Last thyroid functions Lab Results  Component Value Date   TSH 1.480 05/14/2021   T4TOTAL 7.3 05/14/2021   Last vitamin D Lab Results  Component Value Date   VD25OH 21.7 (L) 01/06/2019      Assessment & Plan:   Problem List Items Addressed This Visit       Cardiovascular and Mediastinum   Essential hypertension    Hypertension not well-controlled Will check labs and increase amlodipine to 10 mg daily and bring back in short-term follow-up      Relevant Medications   amLODipine (NORVASC) 10 MG tablet   Other Relevant Orders   Comprehensive  metabolic panel     Genitourinary   Intramural and subserous leiomyoma of uterus    Patient now is wishing to have a hysterectomy will notify gynecology      Relevant Orders   Ambulatory referral to Gynecology     Other   Menorrhagia    Relevant Orders   Ambulatory referral to Gynecology   Iron deficiency anemia due to chronic blood loss - Primary    Reassess iron levels may yet need another iron infusion      Relevant Medications   ferrous sulfate 325 (65 FE) MG tablet   Other Relevant Orders   Ambulatory referral to Gynecology   Chronic pain of both knees    Chronic pain in both knees will make referral to orthopedics      Relevant Orders   Ambulatory referral to Orthopedic Surgery   Other Visit Diagnoses     Iron deficiency anemia, unspecified iron deficiency anemia type       Relevant Medications   ferrous sulfate 325 (65 FE) MG tablet   Other Relevant Orders   CBC with Differential/Platelet   Iron, TIBC and Ferritin Panel      No follow-ups on file.    Asencion Noble, MD

## 2022-01-13 ENCOUNTER — Encounter: Payer: Self-pay | Admitting: Critical Care Medicine

## 2022-01-13 ENCOUNTER — Other Ambulatory Visit: Payer: Self-pay

## 2022-01-13 ENCOUNTER — Ambulatory Visit: Payer: Self-pay | Attending: Critical Care Medicine | Admitting: Critical Care Medicine

## 2022-01-13 VITALS — BP 162/96 | HR 86 | Wt 153.6 lb

## 2022-01-13 DIAGNOSIS — D251 Intramural leiomyoma of uterus: Secondary | ICD-10-CM

## 2022-01-13 DIAGNOSIS — D509 Iron deficiency anemia, unspecified: Secondary | ICD-10-CM

## 2022-01-13 DIAGNOSIS — D500 Iron deficiency anemia secondary to blood loss (chronic): Secondary | ICD-10-CM

## 2022-01-13 DIAGNOSIS — N920 Excessive and frequent menstruation with regular cycle: Secondary | ICD-10-CM

## 2022-01-13 DIAGNOSIS — G8929 Other chronic pain: Secondary | ICD-10-CM

## 2022-01-13 DIAGNOSIS — D252 Subserosal leiomyoma of uterus: Secondary | ICD-10-CM

## 2022-01-13 DIAGNOSIS — M25562 Pain in left knee: Secondary | ICD-10-CM

## 2022-01-13 DIAGNOSIS — M25561 Pain in right knee: Secondary | ICD-10-CM

## 2022-01-13 DIAGNOSIS — I10 Essential (primary) hypertension: Secondary | ICD-10-CM

## 2022-01-13 MED ORDER — FERROUS SULFATE 325 (65 FE) MG PO TABS
325.0000 mg | ORAL_TABLET | Freq: Three times a day (TID) | ORAL | 1 refills | Status: DC
  Filled 2022-01-13: qty 90, 30d supply, fill #0

## 2022-01-13 MED ORDER — AMLODIPINE BESYLATE 10 MG PO TABS
10.0000 mg | ORAL_TABLET | Freq: Every day | ORAL | 1 refills | Status: DC
  Filled 2022-01-13: qty 30, 30d supply, fill #0

## 2022-01-13 NOTE — Assessment & Plan Note (Addendum)
Hypertension not well-controlled Will check labs and increase amlodipine to 10 mg daily and bring back in short-term follow-up

## 2022-01-13 NOTE — Assessment & Plan Note (Signed)
Patient now is wishing to have a hysterectomy will notify gynecology

## 2022-01-13 NOTE — Assessment & Plan Note (Signed)
Chronic pain in both knees will make referral to orthopedics

## 2022-01-13 NOTE — Patient Instructions (Addendum)
You declined the flu vaccine  Screening labs obtained today and iron levels will be obtained  Referral back to Dr. Elgie Congo to have a hysterectomy will be made  If your iron levels are extremely low we will proceed with another iron infusion  Return to Dr. Joya Gaskins 4 months  Referral to orthopedic surgery made for your knee pain  Rechazaste la vacuna contra la gripe.  Se obtuvieron exmenes de laboratorio hoy y se obtendrn niveles de hierro  Se realizar una remisin al Dr. Elgie Congo para que se someta a una histerectoma.  Si sus niveles de hierro son extremadamente bajos procederemos con otra infusin de hierro.  Regreso al Dr. Joya Gaskins 4 meses  Remisin a una ciruga ortopdica hecha para su dolor de rodilla.

## 2022-01-13 NOTE — Assessment & Plan Note (Signed)
Reassess iron levels may yet need another iron infusion

## 2022-01-14 ENCOUNTER — Other Ambulatory Visit: Payer: Self-pay | Admitting: Critical Care Medicine

## 2022-01-14 ENCOUNTER — Other Ambulatory Visit: Payer: Self-pay

## 2022-01-14 DIAGNOSIS — D500 Iron deficiency anemia secondary to blood loss (chronic): Secondary | ICD-10-CM

## 2022-01-14 LAB — COMPREHENSIVE METABOLIC PANEL
ALT: 10 IU/L (ref 0–32)
AST: 16 IU/L (ref 0–40)
Albumin/Globulin Ratio: 2 (ref 1.2–2.2)
Albumin: 4.3 g/dL (ref 3.9–4.9)
Alkaline Phosphatase: 58 IU/L (ref 44–121)
BUN/Creatinine Ratio: 25 — ABNORMAL HIGH (ref 9–23)
BUN: 15 mg/dL (ref 6–20)
Bilirubin Total: <0.2 mg/dL (ref 0.0–1.2)
CO2: 25 mmol/L (ref 20–29)
Calcium: 8.7 mg/dL (ref 8.7–10.2)
Chloride: 105 mmol/L (ref 96–106)
Creatinine, Ser: 0.59 mg/dL (ref 0.57–1.00)
Globulin, Total: 2.2 g/dL (ref 1.5–4.5)
Glucose: 105 mg/dL — ABNORMAL HIGH (ref 70–99)
Potassium: 3.8 mmol/L (ref 3.5–5.2)
Sodium: 140 mmol/L (ref 134–144)
Total Protein: 6.5 g/dL (ref 6.0–8.5)
eGFR: 117 mL/min/{1.73_m2} (ref >59)

## 2022-01-14 LAB — IRON,TIBC AND FERRITIN PANEL
Ferritin: 3 ng/mL — ABNORMAL LOW (ref 15–150)
Iron Saturation: 3 % — CL (ref 15–55)
Iron: 11 ug/dL — ABNORMAL LOW (ref 27–159)
Total Iron Binding Capacity: 349 ug/dL (ref 250–450)
UIBC: 338 ug/dL (ref 131–425)

## 2022-01-14 LAB — CBC WITH DIFFERENTIAL/PLATELET
Basophils Absolute: 0 10*3/uL (ref 0.0–0.2)
Basos: 1 %
EOS (ABSOLUTE): 0.4 10*3/uL (ref 0.0–0.4)
Eos: 5 %
Hematocrit: 23.3 % — ABNORMAL LOW (ref 34.0–46.6)
Hemoglobin: 6.3 g/dL — CL (ref 11.1–15.9)
Immature Grans (Abs): 0 10*3/uL (ref 0.0–0.1)
Immature Granulocytes: 0 %
Lymphocytes Absolute: 2.6 10*3/uL (ref 0.7–3.1)
Lymphs: 36 %
MCH: 18 pg — ABNORMAL LOW (ref 26.6–33.0)
MCHC: 27 g/dL — ABNORMAL LOW (ref 31.5–35.7)
MCV: 67 fL — ABNORMAL LOW (ref 79–97)
Monocytes Absolute: 0.6 10*3/uL (ref 0.1–0.9)
Monocytes: 8 %
Neutrophils Absolute: 3.7 10*3/uL (ref 1.4–7.0)
Neutrophils: 50 %
Platelets: 418 10*3/uL (ref 150–450)
RBC: 3.5 x10E6/uL — ABNORMAL LOW (ref 3.77–5.28)
RDW: 19.5 % — ABNORMAL HIGH (ref 11.7–15.4)
WBC: 7.4 10*3/uL (ref 3.4–10.8)

## 2022-01-14 NOTE — Progress Notes (Signed)
Let pt know she needs iron infusion will set up as before and infusion ctr will call her

## 2022-01-16 ENCOUNTER — Telehealth: Payer: Self-pay

## 2022-01-16 NOTE — Telephone Encounter (Signed)
Pt was called and is aware of results, DOB was confirmed.  Interpreter id [id number]

## 2022-01-16 NOTE — Telephone Encounter (Signed)
-----   Message from Elsie Stain, MD sent at 01/14/2022  8:39 AM EST ----- Let pt know she needs iron infusion will set up as before and infusion ctr will call her

## 2022-01-20 ENCOUNTER — Ambulatory Visit (INDEPENDENT_AMBULATORY_CARE_PROVIDER_SITE_OTHER): Payer: Self-pay

## 2022-01-20 ENCOUNTER — Ambulatory Visit: Payer: Self-pay | Admitting: Orthopaedic Surgery

## 2022-01-20 VITALS — BP 118/74 | HR 80 | Temp 98.8°F | Resp 16 | Ht 63.0 in | Wt 151.6 lb

## 2022-01-20 DIAGNOSIS — N920 Excessive and frequent menstruation with regular cycle: Secondary | ICD-10-CM

## 2022-01-20 DIAGNOSIS — D500 Iron deficiency anemia secondary to blood loss (chronic): Secondary | ICD-10-CM

## 2022-01-20 MED ORDER — DIPHENHYDRAMINE HCL 50 MG/ML IJ SOLN
25.0000 mg | Freq: Once | INTRAMUSCULAR | Status: AC
  Administered 2022-01-20: 25 mg via INTRAVENOUS
  Filled 2022-01-20: qty 1

## 2022-01-20 MED ORDER — ACETAMINOPHEN 325 MG PO TABS
650.0000 mg | ORAL_TABLET | Freq: Once | ORAL | Status: AC
  Administered 2022-01-20: 650 mg via ORAL
  Filled 2022-01-20: qty 2

## 2022-01-20 MED ORDER — SODIUM CHLORIDE 0.9 % IV SOLN
1000.0000 mg | Freq: Once | INTRAVENOUS | Status: AC
  Administered 2022-01-20: 1000 mg via INTRAVENOUS
  Filled 2022-01-20: qty 10

## 2022-01-20 NOTE — Progress Notes (Signed)
Diagnosis: Iron Deficiency Anemia  Provider:  Marshell Garfinkel MD  Procedure: Infusion  IV Type: Peripheral, IV Location: R Antecubital  Monoferric (Ferric Derisomaltose), Dose: 1000 mg  Infusion Start Time: 2440  Infusion Stop Time: 1027  Post Infusion IV Care: Observation period completed and Peripheral IV Discontinued  Discharge: Condition: Good, Destination: Home . AVS provided to patient.   Performed by:  Koren Shiver, RN

## 2022-01-20 NOTE — Patient Instructions (Signed)
Ferric Derisomaltose Injection Qu es este medicamento? La DERISOMALTOSA FRRICA trata los niveles bajos de hierro en el cuerpo (anemia por deficiencia de hierro). El hierro es un mineral que cumple una funcin importante en la produccin de glbulos rojos, que llevan el oxgeno de los pulmones al resto del cuerpo. Este medicamento puede ser utilizado para otros usos; si tiene alguna pregunta consulte con su proveedor de atencin mdica o con su farmacutico. MARCAS COMUNES: MONOFERRIC Qu le debo informar a mi profesional de la salud antes de tomar este medicamento? Necesitan saber si usted presenta alguno de los siguientes problemas o situaciones: Niveles altos de hierro en la Youth worker reaccin alrgica o inusual al hierro, a otros medicamentos, alimentos, colorantes o conservantes Si est embarazada o buscando quedar embarazada Si est amamantando a un beb Cmo debo Insurance account manager medicamento? Este medicamento se inyecta en una vena. Se administra en un hospital o en un entorno clnico. Hable con su equipo de atencin sobre el uso de este medicamento en nios. Puede requerir atencin especial. Sobredosis: Pngase en contacto inmediatamente con un centro toxicolgico o una sala de urgencia si usted cree que haya tomado demasiado medicamento. ATENCIN: [employer] es solo para usted. No comparta este medicamento con nadie. Qu sucede si me olvido de una dosis? Es importante no olvidar ninguna dosis. Llame a su equipo de atencin si no puede asistir a una cita. Qu puede interactuar con este medicamento? No use este medicamento con ninguno de los siguientes productos: Deferoxamina Dimercaprol Otros productos con hierro Puede ser que esta lista no menciona todas las posibles interacciones. Informe a su profesional de [employer]	[place] de [employer] productos a base de hierbas, medicamentos de Outlook o suplementos nutritivos que est tomando. Si usted fuma, consume bebidas alcohlicas o  si utiliza drogas ilegales, indqueselo tambin a su profesional de [employer]	[place]. Algunas sustancias pueden interactuar con su medicamento. A qu debo estar atento al usar [employer]? Visite peridicamente a su equipo de atencin. Si los sntomas no comienzan a mejorar o si empeoran, consulte con su equipo de atencin. Usted podra necesitar realizarse [employer] de sangre mientras est usando [place]. Es posible que deba seguir una dieta especial. Hable con su equipo de atencin. Los alimentos que contienen hierro incluyen granos enteros/cereales, frutas secas, legumbres, o guisantes, vegetales de hojas verdes y asaduras (hgado, rin). Qu efectos secundarios puedo tener al [employer] este medicamento? Efectos secundarios que debe informar a su equipo de atencin tan pronto como sea posible: Reacciones alrgicas: erupcin cutnea, comezn/picazn, urticaria, hinchazn de la cara, los labios, la lengua o la garganta Presin arterial baja: mareo, sensacin de desmayo o aturdimiento, visin borrosa Falta de aliento Efectos secundarios que generalmente no requieren atencin mdica (debe informarlos a su equipo de atencin si persisten o si son molestos): Enrojecimiento Dolor de Special educational needs teacher en las articulaciones Dolor muscular Audiological scientist, enrojecimiento o Actor de la inyeccin Puede ser que esta lista no menciona todos los posibles efectos secundarios. Comunquese a su mdico por asesoramiento mdico [employer]. Usted puede informar los efectos secundarios a la FDA por telfono al 1-800-FDA-1088. Dnde debo guardar mi medicina? Este medicamento se administra en hospitales o clnicas y no necesitar guardarlo en su domicilio. ATENCIN: Este folleto es un resumen. Puede ser que no cubra toda la posible informacin. Si usted tiene preguntas acerca de esta medicina, consulte con su mdico, su farmacutico o su profesional de Technical sales engineer.  2023  Elsevier/Gold Standard (2020-09-06 00:00:00)

## 2022-01-27 ENCOUNTER — Ambulatory Visit (INDEPENDENT_AMBULATORY_CARE_PROVIDER_SITE_OTHER): Payer: Self-pay

## 2022-01-27 ENCOUNTER — Ambulatory Visit (INDEPENDENT_AMBULATORY_CARE_PROVIDER_SITE_OTHER): Payer: Self-pay | Admitting: Orthopaedic Surgery

## 2022-01-27 ENCOUNTER — Ambulatory Visit: Payer: Self-pay

## 2022-01-27 DIAGNOSIS — M25562 Pain in left knee: Secondary | ICD-10-CM

## 2022-01-27 DIAGNOSIS — M25561 Pain in right knee: Secondary | ICD-10-CM

## 2022-01-27 DIAGNOSIS — G8929 Other chronic pain: Secondary | ICD-10-CM

## 2022-01-27 MED ORDER — LIDOCAINE HCL 1 % IJ SOLN
2.0000 mL | INTRAMUSCULAR | Status: AC | PRN
  Administered 2022-01-27: 2 mL

## 2022-01-27 MED ORDER — METHYLPREDNISOLONE ACETATE 40 MG/ML IJ SUSP
40.0000 mg | INTRAMUSCULAR | Status: AC | PRN
  Administered 2022-01-27: 40 mg via INTRA_ARTICULAR

## 2022-01-27 MED ORDER — BUPIVACAINE HCL 0.5 % IJ SOLN
2.0000 mL | INTRAMUSCULAR | Status: AC | PRN
  Administered 2022-01-27: 2 mL via INTRA_ARTICULAR

## 2022-01-27 NOTE — Progress Notes (Signed)
Office Visit Note   Patient: Tracey Gill           Date of Birth: 10/27/1980           MRN: [id number] Visit Date: 01/27/2022              Requested by: Elsie Stain, MD [address] [place],  [place] [address] PCP: Elsie Stain, MD   Assessment & Plan: Visit Diagnoses:  1. Chronic pain of both knees     Plan: Impression is chronic bilateral knee pain and left calf pain.  Regards to her knees, it is hard to tell where her symptoms are coming from, although I feel as though she may have pain from underlying early arthritis.  We have discussed proceeding with left knee cortisone injection to see if this will relieve her symptoms.  If she does get relief in the left knee following the injection, she will follow-up for right knee injection.  If her symptoms do not improve at all she will come back for recheck.  In regards to the left calf pain, we will go ahead and order a venous Doppler ultrasound to rule out DVT although my suspicion is low today.  She will call us with concerns or questions meantime.  This was all discussed through Silo speak interpreter.  Follow-Up Instructions: Return if symptoms worsen or fail to improve.   Orders:  Orders Placed This Encounter  Procedures   Large Joint Inj   XR KNEE 3 VIEW RIGHT   XR KNEE 3 VIEW LEFT   VAS [place] LOWER EXTREMITY VENOUS (DVT)   No orders of the defined types were placed in this encounter.     Procedures: Large Joint Inj: L knee on 01/27/2022 7:45 PM Details: 22 G needle Medications: 2 mL bupivacaine 0.5 %; 2 mL lidocaine 1 %; 40 mg methylPREDNISolone acetate 40 MG/ML Outcome: tolerated well, no immediate complications Patient was prepped and draped in the usual sterile fashion.       Clinical Data: No additional findings.   Subjective: Chief Complaint  Patient presents with   Right Knee - Pain   Left Knee - Pain    HPI patient is a pleasant 41 year old Spanish-speaking female  is here with bilateral knee pain left greater than right in addition to left calf pain for the past few months.  She denies any injury or change in activity.  She notes that her pain is located to the medial aspect of both knees and into the left calf.  Symptoms are constant.  Pain is worse with cold weather.  She denies any locking or catching but notes she has occasional giving way sensation.  She has been taking Aleve recently which does significantly help.  She denies any history of DVT/PE.  She is a non-smoker.  She does not take oral contraceptive pills and is not on any anticoagulants.  Of note, patient is anemic and undergoes regular infusions.  She notes that after her first infusion she did have improvement of symptoms.  Review of Systems as detailed in HPI.  All others reviewed and are negative.   Objective: Vital Signs: There were no vitals taken for this visit.  Physical Exam well-developed well-nourished female no acute distress.  Alert and oriented x 3.  Ortho Exam bilateral knee exam shows no effusion.  Range of motion 0 to 125 degrees.  Mild medial joint line tenderness.  No patellofemoral crepitus.  Ligaments are stable.  Left calf  is soft but tender.  No skin changes or warmth.  Negative Homans.  She is neurovascular intact distally.  Specialty Comments:  No specialty comments available.  Imaging: XR KNEE 3 VIEW RIGHT  Result Date: 01/27/2022 Mild tricompartmental degenerative changes  XR KNEE 3 VIEW LEFT  Result Date: 01/27/2022 Mild tricompartmental degenerative changes    PMFS History: Patient Active Problem List   Diagnosis Date Noted   Chronic pain of both knees 01/13/2022   Iron deficiency anemia due to chronic blood loss 08/04/2021   Menorrhagia 06/16/2021   Intramural and subserous leiomyoma of uterus 02/03/2021   Elevated LDL cholesterol level 10/01/2020   Essential hypertension 09/16/2020   BMI 27.0-27.9,adult 09/16/2020   Past Medical History:   Diagnosis Date   Anemia    Hypertension    Known health problems: none     Family History  Problem Relation Age of Onset   Diabetes Mother    Diabetes Father    Heart disease Father    Diabetes Sister     Past Surgical History:  Procedure Laterality Date   removal of benign right breast mass Left    Social History   Occupational History   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

## 2022-02-13 ENCOUNTER — Ambulatory Visit: Payer: Self-pay

## 2022-02-13 DIAGNOSIS — D500 Iron deficiency anemia secondary to blood loss (chronic): Secondary | ICD-10-CM

## 2022-02-13 DIAGNOSIS — N920 Excessive and frequent menstruation with regular cycle: Secondary | ICD-10-CM

## 2022-02-13 NOTE — Telephone Encounter (Signed)
Using [employer] [phone].   Chief Complaint: Heavy vaginal bleeding Symptoms: dizziness Frequency: onset of heavy bleeding today at 12, menstrual cycle ongoing 3 days Pertinent Negatives: Patient denies blurred vision, other symptoms Disposition: '[x]'$ ED /'[]'$ Urgent Care (no appt availability in office) / '[]'$ Appointment(In office/virtual)/ '[]'$  [facility]/ '[]'$ Home Care/ '[]'$ Refused Recommended Disposition /'[]'$ [facility]/ '[]'$  Follow-up with PCP Additional Notes: Patient reports heavy bleeding with large clots, different than her irregular bleeding, taking Tranexamic for the heavy bleeding. Advised ED, she verbalized understanding.  location addresses given.  Reason for Disposition  Patient sounds very sick or weak to the triager  Answer Assessment - Initial Assessment Questions 1. AMOUNT: "Describe the bleeding that you are having."    - SPOTTING: spotting, or pinkish / brownish mucous discharge; does not fill panty liner or pad    - MILD:  less than 1 pad / hour; less than patient's usual menstrual bleeding   - MODERATE: 1-2 pads / hour; 1 menstrual cup every 6 hours; small-medium blood clots (e.g., pea, grape, small coin)   - SEVERE: soaking 2 or more pads/hour for 2 or more hours; 1 menstrual cup every 2 hours; bleeding not contained by pads or continuous red blood from vagina; large blood clots (e.g., golf ball, large coin)      Severe, large clots, runs out the pad and down the leg 2. ONSET: "When did the bleeding begin?" "Is it continuing now?"     Today 3. MENSTRUAL PERIOD: "When was the last normal menstrual period?" "How is this different than your period?"     No normal periods 4. REGULARITY: "How regular are your periods?"     Irregular 5. ABDOMEN PAIN: "Do you have any pain?" "How bad is the pain?"  (e.g., Scale 1-10; mild, moderate, or severe)   - MILD (1-3): doesn't interfere with normal activities, abdomen soft and not tender to touch     - MODERATE (4-7): interferes with normal activities or awakens from sleep, abdomen tender to touch    - SEVERE (8-10): excruciating pain, doubled over, unable to do any normal activities      5 6. PREGNANCY: "Is there any chance you are pregnant?" "When was your last menstrual period?"     No 7. BREASTFEEDING: "Are you breastfeeding?"     No 8. HORMONE MEDICINES: "Are you taking any hormone medicines, prescription or over-the-counter?" (e.g., birth control pills, estrogen)     No 9. BLOOD THINNER MEDICINES: "Do you take any blood thinners?" (e.g., Coumadin / warfarin, Pradaxa / dabigatran, aspirin)     No 10. CAUSE: "What do you think is causing the bleeding?" (e.g., recent gyn surgery, recent gyn procedure; known bleeding disorder, cervical cancer, polycystic ovarian disease, fibroids)         No 11. HEMODYNAMIC STATUS: "Are you weak or feeling lightheaded?" If Yes, ask: "Can you stand and walk normally?"        Yes, feels like going to fall 12. OTHER SYMPTOMS: "What other symptoms are you having with the bleeding?" (e.g., passed tissue, vaginal discharge, fever, menstrual-type cramps)       Dizzy  Protocols used: Vaginal Bleeding - Abnormal-A-AH

## 2022-02-17 NOTE — Telephone Encounter (Signed)
Another ref placed urgent to [facility]

## 2022-02-17 NOTE — Addendum Note (Signed)
Addended by: Asencion Noble E on: 02/17/2022 05:06 PM   Modules accepted: Orders

## 2022-02-19 ENCOUNTER — Telehealth: Payer: Self-pay | Admitting: Critical Care Medicine

## 2022-02-19 ENCOUNTER — Ambulatory Visit: Payer: Self-pay

## 2022-02-19 NOTE — Telephone Encounter (Signed)
noted 

## 2022-02-19 NOTE — Telephone Encounter (Signed)
Copied from [place] [id number]. Topic: General - Inquiry >> Feb 19, 2022 11:49 AM Marcellus Scott wrote: Reason for CRM: Please see TE from NT 02/13/2022.   Pt returned the call and provided information below. Referral was placed to [facility], and that she was established there as a patient as well. Pt stated she will call to schedule.  Pt asked that we not reach out to her sister anymore stated her sister calls her family with her medical issues.Explained need to update DPR when she comes in office.   Please advise.

## 2022-02-19 NOTE — Telephone Encounter (Signed)
Margreta Journey #[id number] pt called, pt was on the line but needed interpreter, conferenced call but pt never spoke back. Advised interpreter would try again later.   Summary: very weak, dizzy every time she gets her period/abdominal cramping   Pt stated she feels very weak every time she gets her period. Stated her period was about to start and that she felt very dizzy. Abdominal cramping , losing a lot of weight went from 170 to 149 .  Pt declined to speak with NT said please call her back in an hour; she is still at work.   Pt seeking clinical advice.

## 2022-02-19 NOTE — Telephone Encounter (Signed)
Noted  

## 2022-02-19 NOTE — Telephone Encounter (Signed)
Message from [facility]- assistance provided to patient  [11:22 AM] Gardner Candle per her PT FYI-  Pt authorizes all [facility] practices to verbally disclose PHI with Filbert Berthold, [phone] I had success w/ Liliana... it's her sister and she'll reach out to her. I let her know she can call me here if she has questions (because of the language)

## 2022-02-19 NOTE — Telephone Encounter (Signed)
Interpreter [place] [phone]  Chief Complaint: dizziness r/t to periods  Symptoms: dizziness when standing and feeling weak, had heavier period than normal  Frequency: 1 week or more  Pertinent Negatives: Patient denies vaginal bleeding  Disposition: '[]'$ ED /'[]'$ Urgent Care (no appt availability in office) / '[x]'$ Appointment(In office/virtual)/ '[]'$  [facility]/ '[]'$ Home Care/ '[]'$ Refused Recommended Disposition /'[]'$ [facility]/ '[]'$  Follow-up with PCP Additional Notes: pt states her period was heavier than normal this past time. Pt had called previously and was triaged and advised UC but pt states was having dizziness and unable to drive there. Pt has GYN appt on 03/12/22 at 1115, advised pt to keep that appt and pt wanted to schedule appt with practice. Offered appt for 02/23/22 but pt unable to come in d/t work, scheduled pt for 03/19/21 at 43 and recommended if sx get worse to call back or go to UC. Pt verbalized understanding.   Reason for Disposition  [1] MILD dizziness (e.g., walking normally) AND [2] has NOT been evaluated by doctor (or NP/PA) for this  (Exception: Dizziness caused by heat exposure, sudden standing, or poor fluid intake.)  Answer Assessment - Initial Assessment Questions More heavier than normal period  2. LIGHTHEADED: "Do you feel lightheaded?" (e.g., somewhat faint, woozy, weak upon standing)     Feeling weak and dizziness 3. VERTIGO: "Do you feel like either you or the room is spinning or tilting?" (i.e. vertigo)     no 4. SEVERITY: "How bad is it?"  "Do you feel like you are going to faint?" "Can you stand and walk?"   - MILD: Feels slightly dizzy, but walking normally.   - MODERATE: Feels unsteady when walking, but not falling; interferes with normal activities (e.g., school, work).   - SEVERE: Unable to walk without falling, or requires assistance to walk without falling; feels like passing out now.      Mild  5. ONSET:  "When did the dizziness begin?"      Over 1 week  8. CAUSE: "What do you think is causing the dizziness?"     Period related  10. OTHER SYMPTOMS: "Do you have any other symptoms?" (e.g., fever, chest pain, vomiting, diarrhea, bleeding)  Protocols used: Dizziness - Lightheadedness-A-AH

## 2022-02-19 NOTE — Telephone Encounter (Signed)
Attempt to call patient to inform that referral was placed to [facility] and that she was established there as a patient as well.  Patient answered phone but call was disconnected.  Attempted to call back but no answer.

## 2022-03-12 ENCOUNTER — Other Ambulatory Visit: Payer: Self-pay

## 2022-03-12 ENCOUNTER — Telehealth: Payer: Self-pay | Admitting: *Deleted

## 2022-03-12 ENCOUNTER — Other Ambulatory Visit (HOSPITAL_COMMUNITY): Payer: Self-pay

## 2022-03-12 ENCOUNTER — Encounter: Payer: Self-pay | Admitting: Obstetrics and Gynecology

## 2022-03-12 ENCOUNTER — Ambulatory Visit (INDEPENDENT_AMBULATORY_CARE_PROVIDER_SITE_OTHER): Payer: Self-pay | Admitting: Obstetrics and Gynecology

## 2022-03-12 VITALS — BP 129/87 | HR 81 | Wt 150.1 lb

## 2022-03-12 DIAGNOSIS — Z789 Other specified health status: Secondary | ICD-10-CM

## 2022-03-12 DIAGNOSIS — N921 Excessive and frequent menstruation with irregular cycle: Secondary | ICD-10-CM

## 2022-03-12 LAB — CBC
Hematocrit: 27.3 % — ABNORMAL LOW (ref 34.0–46.6)
Hemoglobin: 8.6 g/dL — ABNORMAL LOW (ref 11.1–15.9)
MCH: 24.1 pg — ABNORMAL LOW (ref 26.6–33.0)
MCHC: 31.5 g/dL (ref 31.5–35.7)
MCV: 77 fL — ABNORMAL LOW (ref 79–97)
Platelets: 363 10*3/uL (ref 150–450)
RBC: 3.57 x10E6/uL — ABNORMAL LOW (ref 3.77–5.28)
RDW: 30 % — ABNORMAL HIGH (ref 11.7–15.4)
WBC: 7.3 10*3/uL (ref 3.4–10.8)

## 2022-03-12 MED ORDER — LUPRON DEPOT (3-MONTH) 11.25 MG IM KIT
11.2500 mg | PACK | INTRAMUSCULAR | 0 refills | Status: DC
  Filled 2022-03-12: qty 1, 90d supply, fill #0

## 2022-03-12 NOTE — Progress Notes (Signed)
GYNECOLOGY VISIT  Patient name: Tracey Gill MRN [id number]  Date of birth: Nov 10, 1980 Chief Complaint:   Vaginal Bleeding   History:  Tracey Gill is a 42 y.o. G3P3003 being seen today for bleeding.  Bleeding for 6days with 15-20pads per day. Pain from fibroid, pelvci pain and dyspareunia.  Husband divorced due to not beign abl eto have sex 15-20lbs lost in 6 months Daily BM (previously going more than once a day). No blood in the stool or urine. No issues with voiding  When bleeding she thinks because of the pain she is not hungry and may be why she lost weight. The pain has been present for 6 months. Pain is similar to labor pain. Once bleeding starts, the pain is  constant. When bleeding slows down, stabbing pain. Sensation of belly full/bloating. Feels belly bigger in the  last 6 months. Family has told her she does not look well Husband divorced her due to not being able to have intercourse due to bleeding and pain. Currently works in [employer]. Has financial assistance as well.   Past Medical History:  Diagnosis Date   Anemia    Hypertension    Known health problems: none     Past Surgical History:  Procedure Laterality Date   removal of benign right breast mass Left     The following portions of the patient's history were reviewed and updated as appropriate: allergies, current medications, past family history, past medical history, past social history, past surgical history and problem list.   Health Maintenance:   Last pap     Component Value Date/Time   DIAGPAP  06/16/2021 1046    - Negative for intraepithelial lesion or malignancy (NILM)   [place] Negative 06/16/2021 1046   ADEQPAP  06/16/2021 1046    Satisfactory for evaluation; transformation zone component PRESENT.     Last mammogram: n/a   Review of Systems:  Pertinent items are noted in HPI. Comprehensive review of systems was otherwise negative.   Objective:  Physical  Exam BP 129/87   Pulse 81   Wt 150 lb 1.6 oz (68.1 kg)   BMI 26.59 kg/m    Physical Exam Vitals and nursing note reviewed.  Constitutional:      Appearance: Normal appearance.  HENT:     Head: Normocephalic and atraumatic.  Pulmonary:     Effort: Pulmonary effort is normal.  Abdominal:     Comments: Enlarged abdomen with palpable, tender mass  Skin:    General: Skin is warm and dry.  Neurological:     General: No focal deficit present.     Mental Status: She is alert.  Psychiatric:        Mood and Affect: Mood normal.        Behavior: Behavior normal.        Thought Content: Thought content normal.        Judgment: Judgment normal.      Labs and Imaging No results found.     Assessment & Plan:   1. Menorrhagia with irregular cycle Would like hysterectomy. Hx of rapid growth and weight loss concerning and uterus seems much larger now than on prior scans and visits. Ordered for pelvic MRI for preoperative planning. Discussed that I typically prefer minimally invasive approach but she may need open procedure based on size. MRI will also be able to further characterize if benign appearing fibroid or not as well as other signs concerning for malignancy. EMB on file benign from 09/2021.  Recommend lupron as well to help with bleeding and size of uterus in the interim. CBC today as she describes signs/symptoms of anemia though vitals stable.  - CBC - MR PELVIS W WO CONTRAST; Future - leuprolide (LUPRON DEPOT, 83-MONTH,) 11.25 MG injection; Inject 11.25 mg into the muscle every 3 (three) months.  Dispense: 1 each; Refill: 0  2. Language barrier In person spanish interpreter used for duration of visit    Routine preventative health maintenance measures emphasized.  Darliss Cheney, MD [facility] [place]

## 2022-03-12 NOTE — Telephone Encounter (Signed)
Received a phone call from [facility] with stat lab results of RBC 3.57, Hemoglobin 8.6, hct 27.3, and platelet 363.Information sent to Dr. Currie Paris. Staci Acosta

## 2022-03-19 ENCOUNTER — Ambulatory Visit: Payer: Self-pay | Admitting: Physician Assistant

## 2022-03-24 ENCOUNTER — Ambulatory Visit (HOSPITAL_COMMUNITY)
Admission: RE | Admit: 2022-03-24 | Discharge: 2022-03-24 | Disposition: A | Discharge status: Home / Self Care | Payer: Self-pay | Source: Ambulatory Visit | Attending: Obstetrics and Gynecology | Admitting: Obstetrics and Gynecology

## 2022-03-24 DIAGNOSIS — N921 Excessive and frequent menstruation with irregular cycle: Secondary | ICD-10-CM | POA: Insufficient documentation

## 2022-03-24 MED ORDER — GADOBUTROL 1 MMOL/ML IV SOLN
6.0000 mL | Freq: Once | INTRAVENOUS | Status: AC | PRN
  Administered 2022-03-24: 6 mL via INTRAVENOUS

## 2022-04-01 ENCOUNTER — Encounter: Payer: Self-pay | Admitting: Critical Care Medicine

## 2022-04-01 ENCOUNTER — Encounter: Payer: Self-pay | Admitting: Physician Assistant

## 2022-04-01 ENCOUNTER — Ambulatory Visit: Payer: Self-pay | Attending: Physician Assistant | Admitting: Physician Assistant

## 2022-04-01 ENCOUNTER — Other Ambulatory Visit: Payer: Self-pay

## 2022-04-01 VITALS — BP 118/75 | HR 79 | Temp 98.3°F | Wt 152.6 lb

## 2022-04-01 DIAGNOSIS — D259 Leiomyoma of uterus, unspecified: Secondary | ICD-10-CM

## 2022-04-01 DIAGNOSIS — D500 Iron deficiency anemia secondary to blood loss (chronic): Secondary | ICD-10-CM

## 2022-04-01 DIAGNOSIS — I10 Essential (primary) hypertension: Secondary | ICD-10-CM

## 2022-04-01 DIAGNOSIS — Z789 Other specified health status: Secondary | ICD-10-CM

## 2022-04-01 DIAGNOSIS — R42 Dizziness and giddiness: Secondary | ICD-10-CM

## 2022-04-01 MED ORDER — AMLODIPINE BESYLATE 10 MG PO TABS
10.0000 mg | ORAL_TABLET | Freq: Every day | ORAL | 1 refills | Status: DC
  Filled 2022-04-01: qty 90, 90d supply, fill #0
  Filled 2022-06-12: qty 90, 90d supply, fill #1

## 2022-04-01 NOTE — Patient Instructions (Addendum)
I reached out to Dr Currie Paris and asked them to call you about the MRI and the Lupron injection   Fibromas uterinos Uterine Fibroids  Los fibromas uterinos, tambin llamados liomiomas, son tumores no cancerosos (benignos) que pueden Therapist, art. Pueden causar sangrado menstrual abundante y Social research officer, government. Los fibromas tambin pueden desarrollarse en las trompas de [place], el cuello uterino o los tejidos (ligamentos) cerca del tero. Puede tener uno o muchos fibromas. Los fibromas tienen diferentes tamaos y pesos, y crecen en distintas partes del tero. Algunos pueden crecer hasta volverse bastante grandes. La mayora de los fibromas no requiere tratamiento mdico. Cules son las causas? Se desconoce la causa de esta afeccin. Qu incrementa el riesgo? Es ms probable que tenga esta afeccin si: Tiene entre 30 y 12 aos de edad y no ha [employer]. Tienen antecedentes familiares de esta afeccin. Tienen ascendencia afroamericana. Inici su perodo menstrual a los 10 aos de edad o [place]. No ha tenido hijos. Tiene sobrepeso u obesidad. Cules son los signos o sntomas? Muchas mujeres no tienen sntomas. Los sntomas de esta afeccin pueden incluir los siguientes: Hemorragias menstruales abundantes. Sangrado entre los perodos [employer]ogg. Dolor y presin en la zona plvica, entre los huesos de la cadera. Dolor al Merrill Lynch. Problemas en la vejiga, como necesidad de orinar de inmediato u orinar con ms frecuencia que lo habitual. Incapacidad para tener hijos (infertilidad). Imposibilidad de llevar un embarazo a trmino (aborto espontneo). Cmo se diagnostica? Esta afeccin se puede diagnosticar en funcin de lo siguiente: Los sntomas y los antecedentes mdicos. Un examen fsico. Un examen plvico que incluye la palpacin para detectar tumores. Estudios de diagnstico por imgenes, como una ecografa o una resonancia magntica (RM). Cmo se trata? El  tratamiento para esta afeccin puede incluir visitas de seguimiento con su mdico para controlar si hubo cambios en los fibromas. Otro tipo tratamiento puede incluir: Medicamentos, como por ejemplo: Medicamentos para Best boy, entre ellos aspirina y antiinflamatorios no esteroideos (AINE), como ibuprofeno o naproxeno. Terapia hormonal. El tratamiento puede administrarse en forma de pldora o de inyeccin, o puede insertarse en el tero mediante un dispositivo intrauterino (DIU). Una ciruga que hara una de estas cosas: Extirpar los fibromas (miomectoma). Puede recomendarse esta opcin si los fibromas afectan su fertilidad y tiene deseos de [place]. Extirpar el tero (histerectoma). Bloquear la irrigacin sangunea a los fibromas (embolizacin de la arteria uterina). Esto puede hacer que se encojan y [place]. Siga estas instrucciones en su casa: Medicamentos Delphi de venta libre y los recetados solamente como se lo haya indicado el mdico. Consulte a su mdico si debe tomar comprimidos de hierro o comer ms alimentos ricos en hierro, como verduras de hojas de color verde oscuro. El sangrado menstrual excesivo pueden causar niveles bajos de hierro. Control del dolor Si se lo indican, aplique calor en la espalda o el abdomen para Best boy. Use la fuente de calor que el mdico le recomiende, como una compresa de calor hmedo o una almohadilla trmica. Para aplicar calor: Coloque una toalla entre la piel y la fuente de Freight forwarder. Aplique calor durante 20 a 30 minutos. Retire la fuente de calor si la piel se pone de color rojo brillante. Esto es especialmente importante si no puede sentir dolor, calor o fro. Puede correr un mayor riesgo de sufrir quemaduras.  Instrucciones generales Preste mucha atencin a su ciclo menstrual. Informe al mdico si nota algn cambio, por ejemplo: Hemorragia ms intensa que requiere que [place]  las compresas o los tampones ms de lo  habitual. Un cambio en el nmero de das que le dura el perodo menstrual. Un cambio en los sntomas asociados con el perodo menstrual, como dolor de espalda o clicos abdominales. Cumpla con todas las visitas de seguimiento. Esto es importante, especialmente si los fibromas deben controlarse para Actuary cambio. Comunquese con un mdico si: Tiene dolor plvico, dolor de espalda o clicos abdominales que no se alivian con los medicamentos o al Presenter, broadcasting. Presenta un nuevo sangrado entre los [employer] Parcel. Observa un aumento del sangrado durante o entre los perodos [place]. Se siente ms dbil o cansado que de costumbre. Se siente mareado. Solicite ayuda de inmediato si: Se desmaya. Tiene un dolor plvico que empeora sbitamente. Tiene sangrado vaginal intenso que empapa un tampn o un apsito en 30 minutos o menos. Resumen Los fibromas uterinos son tumores no cancerosos (benignos) que pueden Therapist, art. Se desconoce la causa exacta de esta afeccin. La mayora de los fibromas no necesitan tratamiento mdico, a menos que afecten su capacidad para tener hijos (fertilidad). Comunquese con el mdico si tiene dolor plvico, dolor de espalda o clicos abdominales que no se alivian con medicamentos. Obtenga ayuda de inmediato si se desmaya, tiene dolor plvico que empeora repentinamente o tiene una hemorragia vaginal intensa. Esta informacin no tiene Marine scientist el consejo del mdico. Asegrese de hacerle al mdico cualquier pregunta que tenga. Document Revised: 10/24/2019 Document Reviewed: 10/24/2019 Elsevier Patient Education  [place].

## 2022-04-01 NOTE — Progress Notes (Signed)
Patient ID: Tracey Gill, female   DOB: [birth year], 42 y.o.   MRN: [id number]     Tracey Gill, is a 41 y.o. female  [id number]  [id number]  DOB - Jan 10, 1981  Chief Complaint  Patient presents with   Dizziness       Subjective:   Tracey Gill is a 42 y.o. female here today for BP follow up and continued dizziness related to menorrhagia.  She is being followed by Dr Currie Paris for menorrhagia and uterine fibroids-recent MRI and Rx Lupron.  The patient says she is unsure how she is to go about getting the Lupron injection. She has not been told her MRI results but I do see she has a f/up appt with Dr Currie Paris on 04/14/2022.  she denies PICA currently.  Last Hgb 03/12/2022 and was 8.6(up from 6.3).  she has a letter with her that she has been approved for additional iron infusions.  She is currently taking OTC iron bid but still has dizziness.  No tinnitus.  No SOB. She does say that she felt much better after having an iron infusion a few months ago.    No problems updated.  ALLERGIES: No Known Allergies  PAST MEDICAL HISTORY: Past Medical History:  Diagnosis Date   Anemia    Hypertension    Known health problems: none     MEDICATIONS AT HOME: Prior to Admission medications   Medication Sig Start Date End Date Taking? Authorizing Provider  amLODipine (NORVASC) 10 MG tablet Take 1 tablet (10 mg total) by mouth daily. To lower blood pressure 04/01/22   Freeman Caldron M, PA-C  ferrous sulfate 325 (65 FE) MG tablet Take 1 tablet (325 mg total) by mouth 3 (three) times daily with meals. 01/13/22   Elsie Stain, MD  leuprolide (LUPRON DEPOT, 70-MONTH,) 11.25 MG injection Inject 11.25 mg into the muscle every 3 (three) months. 03/12/22   Darliss Cheney, MD  neomycin-polymyxin b-dexamethasone (MAXITROL) 3.5-10000-0.1 OINT SMARTSIG:1 Inch(es) In Eye(s) Every Night 08/23/21   [provider]  prednisoLONE acetate (PRED FORTE) 1 % ophthalmic  suspension SMARTSIG:In Eye(s) 08/23/21   [provider]    ROS: Neg HEENT Neg resp Neg cardiac Neg GI Neg GU Neg MS Neg psych Neg neuro  Objective:   Vitals:   04/01/22 1425  BP: 118/75  Pulse: 79  Temp: 98.3 F (36.8 C)  SpO2: 99%  Weight: 152 lb 9.6 oz (69.2 kg)   Exam General appearance : Awake, alert, not in any distress. Speech Clear. Not toxic looking HEENT: Atraumatic and Normocephalic Neck: Supple, no JVD. No cervical lymphadenopathy.  Chest: Good air entry bilaterally, CTAB.  No rales/rhonchi/wheezing CVS: S1 S2 regular, no murmurs.  Extremities: B/L Lower Ext shows no edema, both legs are warm to touch Neurology: Awake alert, and oriented X 3, CN II-XII intact, Non focal Skin: No Rash  Data Review No results found for: "HGBA1C"  Assessment & Plan   1. Iron deficiency anemia due to chronic blood loss Continue iron - Ambulatory referral to Hematology / Oncology  2. Dizziness No red flags neurologically speaking.  If worsens or develops other s/sx-to ED Continue iron,  drink 80-100 ounces water daily.  Will try and facilitate infusions-patient has a letter showing approval for the infusions to be covered.   - Ambulatory referral to Hematology / Oncology  3. Essential hypertension controlled - amLODipine (NORVASC) 10 MG tablet; Take 1 tablet (10 mg total) by mouth daily. To lower blood pressure  Dispense: 90 tablet;  Refill: 1  4. Uterine leiomyoma, unspecified location I did review her MRI with her.  She sees gyn in 2 weeks(I showed her appt on AVS and she verbalized understanding).  I did reach out to see if gyn can let her know her results/recommendations as well as how to obtain the Lupron - Ambulatory referral to Hematology / Oncology  5. Language barrier [employer] "Geralyn Flash" interpreters used and additional time performing visit was required.  Return in about 6 months (around 09/30/2022) for Dr Joya Gaskins for chronic conditions.  The patient was  given clear instructions to go to ER or return to medical center if symptoms don't improve, worsen or new problems develop. The patient verbalized understanding. The patient was told to call to get lab results if they haven't heard anything in the next week.      Freeman Caldron, PA-C [facility] [place] [employer], [place]   04/01/2022, 3:16 PM

## 2022-04-02 ENCOUNTER — Telehealth: Payer: Self-pay | Admitting: Hematology

## 2022-04-02 NOTE — Telephone Encounter (Signed)
Scheduled appt per 2/14 referral. Used interpreter services. Pt is aware of appt date and time. Pt is aware to arrive 15 mins prior to appt time and to bring and updated insurance card. Pt is aware of appt location.

## 2022-04-04 ENCOUNTER — Inpatient Hospital Stay (HOSPITAL_BASED_OUTPATIENT_CLINIC_OR_DEPARTMENT_OTHER): Payer: Self-pay | Admitting: Hematology

## 2022-04-04 ENCOUNTER — Inpatient Hospital Stay: Payer: Self-pay | Attending: Hematology

## 2022-04-04 VITALS — BP 118/57 | HR 76 | Temp 98.2°F | Resp 20 | Wt 152.0 lb

## 2022-04-04 DIAGNOSIS — R5383 Other fatigue: Secondary | ICD-10-CM | POA: Insufficient documentation

## 2022-04-04 DIAGNOSIS — D500 Iron deficiency anemia secondary to blood loss (chronic): Secondary | ICD-10-CM

## 2022-04-04 DIAGNOSIS — R252 Cramp and spasm: Secondary | ICD-10-CM

## 2022-04-04 DIAGNOSIS — F5089 Other specified eating disorder: Secondary | ICD-10-CM | POA: Insufficient documentation

## 2022-04-04 DIAGNOSIS — N920 Excessive and frequent menstruation with regular cycle: Secondary | ICD-10-CM

## 2022-04-04 DIAGNOSIS — I10 Essential (primary) hypertension: Secondary | ICD-10-CM | POA: Insufficient documentation

## 2022-04-04 LAB — CMP (CANCER CENTER ONLY)
ALT: 10 U/L (ref 0–44)
AST: 10 U/L — ABNORMAL LOW (ref 15–41)
Albumin: 4 g/dL (ref 3.5–5.0)
Alkaline Phosphatase: 45 U/L (ref 38–126)
Anion gap: 5 (ref 5–15)
BUN: 8 mg/dL (ref 6–20)
CO2: 27 mmol/L (ref 22–32)
Calcium: 8.8 mg/dL — ABNORMAL LOW (ref 8.9–10.3)
Chloride: 106 mmol/L (ref 98–111)
Creatinine: 0.45 mg/dL (ref 0.44–1.00)
GFR, Estimated: >60 mL/min (ref >60)
Glucose, Bld: 87 mg/dL (ref 70–99)
Potassium: 4 mmol/L (ref 3.5–5.1)
Sodium: 138 mmol/L (ref 135–145)
Total Bilirubin: 0.3 mg/dL (ref 0.3–1.2)
Total Protein: 6.4 g/dL — ABNORMAL LOW (ref 6.5–8.1)

## 2022-04-04 LAB — CBC WITH DIFFERENTIAL (CANCER CENTER ONLY)
Abs Immature Granulocytes: 0 10*3/uL (ref 0.00–0.07)
Basophils Absolute: 0 10*3/uL (ref 0.0–0.1)
Basophils Relative: 1 %
Eosinophils Absolute: 0.3 10*3/uL (ref 0.0–0.5)
Eosinophils Relative: 6 %
HCT: 27.1 % — ABNORMAL LOW (ref 36.0–46.0)
Hemoglobin: 8.2 g/dL — ABNORMAL LOW (ref 12.0–15.0)
Immature Granulocytes: 0 %
Lymphocytes Relative: 42 %
Lymphs Abs: 1.9 10*3/uL (ref 0.7–4.0)
MCH: 23.2 pg — ABNORMAL LOW (ref 26.0–34.0)
MCHC: 30.3 g/dL (ref 30.0–36.0)
MCV: 76.8 fL — ABNORMAL LOW (ref 80.0–100.0)
Monocytes Absolute: 0.4 10*3/uL (ref 0.1–1.0)
Monocytes Relative: 10 %
Neutro Abs: 1.8 10*3/uL (ref 1.7–7.7)
Neutrophils Relative %: 41 %
Platelet Count: 357 10*3/uL (ref 150–400)
RBC: 3.53 MIL/uL — ABNORMAL LOW (ref 3.87–5.11)
RDW: 21.4 % — ABNORMAL HIGH (ref 11.5–15.5)
WBC Count: 4.4 10*3/uL (ref 4.0–10.5)
nRBC: 0 % (ref 0.0–0.2)

## 2022-04-04 LAB — IRON AND IRON BINDING CAPACITY (CC-WL,HP ONLY)
Iron: 10 ug/dL — ABNORMAL LOW (ref 28–170)
Saturation Ratios: 3 % — ABNORMAL LOW (ref 10.4–31.8)
TIBC: 347 ug/dL (ref 250–450)
UIBC: 337 ug/dL (ref 148–442)

## 2022-04-04 LAB — FERRITIN: Ferritin: 4 ng/mL — ABNORMAL LOW (ref 11–307)

## 2022-04-04 LAB — VITAMIN B12: Vitamin B-12: 631 pg/mL (ref 180–914)

## 2022-04-04 NOTE — Progress Notes (Signed)
Marland Kitchen   HEMATOLOGY/ONCOLOGY CONSULTATION NOTE  Date of Service: 04/04/2022  Patient Care Team: Elsie Stain, MD as PCP - General (Pulmonary Disease)  CHIEF COMPLAINTS/PURPOSE OF CONSULTATION:  Iron deficiency anemia  HISTORY OF PRESENTING ILLNESS:  Tracey Gill is a wonderful 42 y.o. female who has been referred to [place] by Dr Freeman Caldron PA-C for evaluation and management of iron deficiency anemia and consideration of IV iron.  Patient notes that she has had heavy periods for a long time but over the last 1 year she is having very intensely heavy periods due to her large uterine fibroid.  She notes that she has been started on Lupron from 04/14/2022 and is being scheduled for hysterectomy in the next month or 2.  She notes that she is having significant fatigue with intermittent lightheadedness.  Her hemoglobin was as low as 6.3 in November 2023 and she received IV iron infusions x 3 at an outside infusion facility by her primary care physician.  She notes that she felt a little better briefly but is still feeling quite fatigued.  Her last hemoglobin level was 8.6 on 03/11/2022. Iron labs in November on 01/13/2022 showed a ferritin of 3 and iron saturation of 3%. Patient denies having had PRBC transfusions in the past.  No other evidence of GI bleeding, hematuria or other sources of blood loss. Does note significant myalgias, hair loss and brittle nails. Does have pica symptoms and notes that she feels like eating mud but has not been doing it. In person Spanish interpreter used for this interview. 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  Tobacco Use   Smoking status: Never   Smokeless tobacco: Never  Vaping Use   Vaping Use: Never used  Substance and Sexual Activity   Alcohol use: No   Drug use: No   Sexual activity: Yes  Other Topics Concern   Not on file  Social History Narrative   Not on file   Social Determinants of Health   Financial Resource Strain: Not on file  Food Insecurity: Not on file  Transportation Needs: Not on file  Physical Activity: Not on file  Stress: Not on file  Social Connections: Not on file  Intimate Partner Violence: Not on file    FAMILY HISTORY: Family History  Problem Relation Age of Onset   Diabetes Mother    Diabetes Father    Heart disease Father    Diabetes Sister     ALLERGIES:  has No Known Allergies.  MEDICATIONS:  Current Outpatient Medications  Medication Sig Dispense Refill   amLODipine (NORVASC) 10 MG tablet Take 1 tablet (10 mg total) by mouth daily. To lower blood pressure 90 tablet 1   ferrous sulfate 325 (65 FE) MG tablet Take 1 tablet (325 mg total) by mouth 3 (three) times daily with meals. 90 tablet 1   leuprolide (LUPRON DEPOT, 29-MONTH,) 11.25 MG injection Inject 11.25 mg into the muscle every 3 (  three) months. 1 each 0   neomycin-polymyxin b-dexamethasone (MAXITROL) 3.5-10000-0.1 OINT SMARTSIG:1 Inch(es) In Eye(s) Every Night     prednisoLONE acetate (PRED FORTE) 1 % ophthalmic suspension SMARTSIG:In Eye(s)     No current facility-administered medications for this visit.    REVIEW OF SYSTEMS:    10 Point review of Systems was done is negative except as noted above.  PHYSICAL EXAMINATION: ECOG PERFORMANCE STATUS: 1 - Symptomatic but completely ambulatory  . Vitals:   04/04/22 0835  BP: (!) 118/57  Pulse: 76  Resp: 20  Temp: 98.2 F (36.8 C)  SpO2: 100%   Filed Weights   04/04/22 0835  Weight: 152 lb (68.9 kg)    .Body mass index is 26.93 kg/m.  GENERAL:alert, in no acute distress and comfortable SKIN: no acute rashes, no significant lesions, appears pale EYES: Conjunctival pallor noted OROPHARYNX: MMM, no exudates, no oropharyngeal erythema or ulceration NECK: supple, no JVD LYMPH:  no palpable lymphadenopathy in the cervical, axillary or inguinal regions LUNGS: clear to auscultation b/l with normal respiratory effort HEART: regular rate & rhythm ABDOMEN:  normoactive bowel sounds , large bulky uterus up to the umbilicus due to fibroids Extremity: no pedal edema PSYCH: alert & oriented x 3 with fluent speech NEURO: no focal motor/sensory deficits  LABORATORY DATA:  I have reviewed the data as listed  .    Latest Ref Rng & Units 03/12/2022   12:03 PM 01/13/2022    4:11 PM 05/14/2021    1:59 PM  CBC  WBC 3.4 - 10.8 x10E3/uL 7.3  7.4  7.0   Hemoglobin 11.1 - 15.9 g/dL 8.6  6.3  9.2   Hematocrit 34.0 - 46.6 % 27.3  23.3  30.6   Platelets 150 - 450 x10E3/uL 363  418  356     .    Latest Ref Rng & Units 01/13/2022    4:11 PM 05/14/2021    1:59 PM 12/22/2020   11:30 AM  CMP  Glucose 70 - 99 mg/dL 105  89  90   BUN 6 - 20 mg/dL 15  12  12   $ Creatinine 0.57 - 1.00 mg/dL 0.59  0.61  0.48   Sodium 134 - 144 mmol/L 140  139  136   Potassium 3.5 - 5.2 mmol/L 3.8  4.0  3.4   Chloride 96 - 106 mmol/L 105  104  108   CO2 20 - 29 mmol/L 25  24  21   $ Calcium 8.7 - 10.2 mg/dL 8.7  8.5  8.5   Total Protein 6.0 - 8.5 g/dL 6.5  6.5  6.9   Total Bilirubin 0.0 - 1.2 mg/dL <0.2  <0.2  0.4   Alkaline Phos 44 - 121 IU/L 58  66  54   AST 0 - 40 IU/L 16  12  17   $ ALT 0 - 32 IU/L 10  8  13    $ . Lab Results  Component Value Date   IRON 11 (L) 01/13/2022   TIBC 349 01/13/2022   IRONPCTSAT 3 (LL) 01/13/2022   (Iron and TIBC)  Lab Results  Component Value Date   FERRITIN 3 (L) 01/13/2022     RADIOGRAPHIC STUDIES: I have personally reviewed the radiological images as listed and agreed with  the findings in the report. MR PELVIS W WO CONTRAST  Result Date: 03/24/2022 CLINICAL DATA:  Pelvic pain and dyspareunia. Abnormal uterine bleeding. Uterine fibroids. Treatment planning. EXAM: MRI PELVIS WITHOUT AND WITH CONTRAST TECHNIQUE: Multiplanar multisequence MR  imaging of the pelvis was performed both before and after administration of intravenous contrast. CONTRAST:  35m GADAVIST GADOBUTROL 1 MMOL/ML IV SOLN COMPARISON:  None Available. FINDINGS: Lower Urinary Tract: No bladder or urethral abnormality identified. Bowel:  Unremarkable visualized pelvic bowel loops. Vascular/Lymphatic: No pathologically enlarged lymph nodes or other significant abnormality. Reproductive: -- Uterus: Measures 16.1 by 11.0 by 15.2 cm (volume = 1410 cm^3). A dominant fibroid is seen in the anterior uterine corpus which has both intramural and submucosal components, and measures 11.1 cm in maximum diameter. This fibroid shows heterogeneous internal edema, consistent with degeneration. A few other tiny intramural fibroids are seen measuring up to 2 cm in the right lateral uterine wall. -- Intracavitary fibroids:  None. -- Pedunculated fibroids: A pedunculated fibroid is seen arising from the right lateral upper uterine corpus which measures 4.8 cm in maximum diameter. Soft tissue pedicle of attachment to the uterus measures approximately 3.3 cm in thickness. -- Fibroid contrast enhancement: All fibroids show contrast enhancement. The dominant anterior uterine fibroid showing internal edema and heterogeneous enhancement consistent with degeneration. -- Right ovary:  Appears normal.  No mass identified. -- Left ovary:  Appears normal.  No mass identified. Other: Tiny amount of free fluid in pelvic cul-de-sac. Musculoskeletal:  Unremarkable. IMPRESSION: Markedly enlarged uterus with dominant 11.1 cm fibroid in the anterior uterine corpus, with large submucosal component. This fibroid shows heterogeneous enhancement and internal  edema, consistent with degeneration. 4.8 cm pedunculated fibroid arising from the right lateral upper uterine corpus, with soft pedicle measuring 3.3 cm in thickness. No intracavitary fibroids identified. Normal appearance of both ovaries. No adnexal mass identified. Electronically Signed   By: JMarlaine HindM.D.   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  Patient is craving mud but has not really consumed this. #4 muscle cramps could be from anemia iron deficiency but will rule out other deficiencies. Plan -Discussed patient's medical history clinical symptomatology in detail. -Her previous labs were reviewed and she was noted to have severe iron deficiency due to her extensive menorrhagia. -She is following with OB/GYN and is being scheduled to start Lupron shots on 04/14/2022 and is not planning for a possible hysterectomy in the next month or so. -We discussed that due to her severe iron deficiency and anemia, symptoms from symptomatic anemia and needing hysterectomy we would need to treat her iron deficiency aggressively with IV iron to try to attempt to increase her hemoglobin about 10 prior to hysterectomy. -If hysterectomy has to be done urgently she could always be transfused to hemoglobin of 10. -We will set her up for labs today and hopefully IV iron as soon as possible within a week. -We shall check her B12 levels and vitamin D levels -She was recommended to start a vitamin B complex 1 capsule p.o. daily to support accelerated hematopoiesis.  Follow-up Labs today IV Venofer 300 mg weekly x 4 doses to start a soon as possible Return to clinic with Dr. KIrene Limbowith labs in 4 weeks  The total time spent in the appointment was 46 minutes*.  All of the patient's questions were answered with apparent  satisfaction. The patient knows to call the clinic with any problems, questions or concerns.   GSullivan LoneMD MS AAHIVMS SSt. Luke'S [facility] Wood[facility] HospitalCUt [facility] QuitmanHematology/Oncology Physician CDelray Beach Surgery Center .*Total  Encounter Time as defined by the Centers for Medicare and Medicaid Services includes, in addition to the face-to-face time of a patient visit (documented in the note above) non-face-to-face time: obtaining and reviewing outside history, ordering and reviewing medications, tests or procedures, care coordination (communications with other health care professionals or caregivers) and documentation in the medical record.   04/04/2022 8:11 AM

## 2022-04-06 ENCOUNTER — Telehealth: Payer: Self-pay | Admitting: Hematology

## 2022-04-06 NOTE — Telephone Encounter (Signed)
Patient called to reschedule appointments due to new work schedule conflict. Adjusted appointments and patient notified.

## 2022-04-06 NOTE — Telephone Encounter (Signed)
Called patient using interpreter line to reschedule iron infusions per charge. Patient r/s and notified.

## 2022-04-06 NOTE — Telephone Encounter (Signed)
Called patient per 2/17 los notes to schedule f/u with interpreter services. Left voicemail using the interpreter service with new appointment information.

## 2022-04-07 LAB — VITAMIN D 25 HYDROXY (VIT D DEFICIENCY, FRACTURES)

## 2022-04-08 ENCOUNTER — Ambulatory Visit: Payer: Self-pay

## 2022-04-08 ENCOUNTER — Telehealth: Payer: Self-pay | Admitting: Hematology

## 2022-04-08 NOTE — Telephone Encounter (Signed)
Patient called back returning call from 2/21 morning to reschedule 2/23 appointment. Patient rescheduled and notified.

## 2022-04-10 ENCOUNTER — Inpatient Hospital Stay: Payer: Self-pay

## 2022-04-10 ENCOUNTER — Other Ambulatory Visit: Payer: Self-pay

## 2022-04-10 ENCOUNTER — Ambulatory Visit: Payer: Self-pay

## 2022-04-10 VITALS — BP 136/79 | HR 65 | Temp 98.4°F | Resp 16 | Wt 151.8 lb

## 2022-04-10 DIAGNOSIS — D500 Iron deficiency anemia secondary to blood loss (chronic): Secondary | ICD-10-CM

## 2022-04-10 DIAGNOSIS — N920 Excessive and frequent menstruation with regular cycle: Secondary | ICD-10-CM

## 2022-04-10 MED ORDER — ACETAMINOPHEN 325 MG PO TABS
650.0000 mg | ORAL_TABLET | Freq: Once | ORAL | Status: AC
  Administered 2022-04-10: 650 mg via ORAL
  Filled 2022-04-10: qty 2

## 2022-04-10 MED ORDER — LORATADINE 10 MG PO TABS
10.0000 mg | ORAL_TABLET | Freq: Once | ORAL | Status: AC
  Administered 2022-04-10: 10 mg via ORAL
  Filled 2022-04-10: qty 1

## 2022-04-10 MED ORDER — SODIUM CHLORIDE 0.9 % IV SOLN
300.0000 mg | Freq: Once | INTRAVENOUS | Status: AC
  Administered 2022-04-10: 300 mg via INTRAVENOUS
  Filled 2022-04-10: qty 300

## 2022-04-10 MED ORDER — SODIUM CHLORIDE 0.9 % IV SOLN: Freq: Once | INTRAVENOUS | Status: AC

## 2022-04-10 NOTE — Patient Instructions (Signed)
Iron Sucrose Injection Qu es este medicamento? El HIERRO SACAROSA trata los niveles bajos de hierro (anemia por deficiencia de Sport and exercise psychologist) en personas con enfermedad renal. El hierro es un mineral que cumple una funcin importante en la produccin de glbulos rojos, que llevan el oxgeno de los pulmones al resto del cuerpo. Este medicamento puede ser utilizado para otros usos; si tiene alguna pregunta consulte con su proveedor de atencin mdica o con su farmacutico. MARCAS COMUNES: Venofer Qu le debo informar a mi profesional de la salud antes de tomar este medicamento? Necesitan saber si usted presenta alguno de los [employer] o situaciones: Anemia no causada por niveles bajos de hierro Engineer, building services altos de hierro en la sangre Enfermedad renal Enfermedad heptica Una reaccin alrgica o inusual al hierro, a otros medicamentos, alimentos, colorantes o conservantes Si est embarazada o buscando quedar embarazada Si est amamantando a un beb Cmo debo Insurance account manager medicamento? Este medicamento se administra mediante infusin en una vena. Se administra en un hospital o en un entorno clnico. Hable con su equipo de atencin sobre el uso de este medicamento en nios. Aunque este medicamento se puede recetar a nios tan pequeos como de 2 aos de edad con ciertas afecciones, existen precauciones que deben tomarse. Sobredosis: Pngase en contacto inmediatamente con un centro toxicolgico o una sala de urgencia si usted cree que haya tomado demasiado medicamento. ATENCIN: [employer] es solo para usted. No comparta este medicamento con nadie. Qu sucede si me olvido de una dosis? Cumpla con las citas para dosis de seguimiento. Es importante no olvidar ninguna dosis. Llame a su equipo de atencin si no puede asistir a una cita. Qu puede interactuar con este medicamento? No use este medicamento con ninguno de los siguientes  productos: Deferoxamina Dimercaprol Otros productos con hierro Fish farm manager tambin podra Counselling psychologist con los siguientes productos: Cloranfenicol Deferasirox Puede ser que esta lista no menciona todas las posibles interacciones. Informe a su profesional de [employer]	[place] de [employer] productos a base de hierbas, medicamentos de [place] o suplementos nutritivos que est tomando. Si usted fuma, consume bebidas alcohlicas o si utiliza drogas ilegales, indqueselo tambin a su profesional de [employer]	[place]. Algunas sustancias pueden interactuar con su medicamento. A qu debo estar atento al usar [employer]? Visite peridicamente a su equipo de atencin. Si los sntomas no comienzan a mejorar o si empeoran, informe a su equipo de atencin. Usted podra necesitar realizarse [employer] de sangre mientras est usando [place]. Es posible que deba seguir una dieta especial. Hable con su equipo de atencin. Los alimentos que contienen hierro incluyen: granos enteros/cereales, frutas secas, legumbres, o guisantes, verduras de hojas verdes y asaduras (hgado, rin). Qu efectos secundarios puedo tener al [employer] este medicamento? Efectos secundarios que debe informar a su equipo de atencin tan pronto como sea posible: Reacciones alrgicas: erupcin cutnea, comezn/picazn, urticaria, hinchazn de la cara, los labios, la lengua o la garganta Presin arterial baja: mareo, sensacin de desmayo o aturdimiento, visin borrosa Falta de aliento Efectos secundarios que generalmente no requieren atencin mdica (debe informarlos a su equipo de atencin si persisten o si son molestos): Enrojecimiento Dolor de Special educational needs teacher en las articulaciones Dolor muscular Audiological scientist, enrojecimiento o Actor de la inyeccin Puede ser que esta lista no menciona todos los posibles efectos secundarios. Comunquese a su mdico por asesoramiento mdico [employer]. Usted puede  informar los efectos secundarios a la FDA por telfono al 1-800-FDA-1088. Dnde debo guardar mi medicina?  Este medicamento se administra en hospitales o clnicas y no es necesario guardarlo en su domicilio. ATENCIN: Este folleto es un resumen. Puede ser que no cubra toda la posible informacin. Si usted tiene preguntas acerca de esta medicina, consulte con su mdico, su farmacutico o su profesional de Technical sales engineer.  2023 Elsevier/Gold Standard (2021-12-24 00:00:00)

## 2022-04-13 ENCOUNTER — Encounter: Payer: Self-pay | Admitting: Hematology

## 2022-04-13 ENCOUNTER — Encounter: Payer: Self-pay | Admitting: Critical Care Medicine

## 2022-04-14 ENCOUNTER — Encounter: Payer: Self-pay | Admitting: Critical Care Medicine

## 2022-04-14 ENCOUNTER — Ambulatory Visit (INDEPENDENT_AMBULATORY_CARE_PROVIDER_SITE_OTHER): Payer: Self-pay | Admitting: Obstetrics and Gynecology

## 2022-04-14 ENCOUNTER — Encounter: Payer: Self-pay | Admitting: Hematology

## 2022-04-14 ENCOUNTER — Other Ambulatory Visit: Payer: Self-pay

## 2022-04-14 VITALS — BP 144/79 | HR 82 | Wt 151.9 lb

## 2022-04-14 DIAGNOSIS — N920 Excessive and frequent menstruation with regular cycle: Secondary | ICD-10-CM

## 2022-04-14 DIAGNOSIS — Z789 Other specified health status: Secondary | ICD-10-CM

## 2022-04-14 MED ORDER — MEGESTROL ACETATE 40 MG PO TABS
40.0000 mg | ORAL_TABLET | Freq: Two times a day (BID) | ORAL | 5 refills | Status: DC
  Filled 2022-04-14 – 2022-04-22 (×2): qty 60, 30d supply, fill #0

## 2022-04-14 NOTE — Progress Notes (Signed)
GYNECOLOGY VISIT  Patient name: Tracey Gill MRN [id number]  Date of birth: [birth year] Chief Complaint:   Pre-op Exam   History:  Tracey Gill is a 42 y.o. G3P3003 being seen today for discussion of MRI results and surgery. Received lupron and will be getting IV iron transfusion. Had menses start last Wednesday and has continued bleeding. Has been approved for financial assistance and will need renewal in March. Would like to proceed with definitive management.     Past Medical History:  Diagnosis Date   Anemia    Hypertension    Known health problems: none     Past Surgical History:  Procedure Laterality Date   removal of benign right breast mass Left     The following portions of the patient's history were reviewed and updated as appropriate: allergies, current medications, past family history, past medical history, past social history, past surgical history and problem list.     Review of Systems:  Pertinent items are noted in HPI. Comprehensive review of systems was otherwise negative.   Objective:  Physical Exam BP (!) 144/79   Pulse 82   Wt 151 lb 14.4 oz (68.9 kg)   BMI 26.91 kg/m    Physical Exam Vitals and nursing note reviewed.  Constitutional:      Appearance: Normal appearance.  HENT:     Head: Normocephalic and atraumatic.  Pulmonary:     Effort: Pulmonary effort is normal.  Skin:    General: Skin is warm and dry.  Neurological:     General: No focal deficit present.     Mental Status: She is alert.  Psychiatric:        Mood and Affect: Mood normal.        Behavior: Behavior normal.        Thought Content: Thought content normal.        Judgment: Judgment normal.      Labs and Imaging MR PELVIS W WO CONTRAST  Result Date: 03/24/2022 CLINICAL DATA:  Pelvic pain and dyspareunia. Abnormal uterine bleeding. Uterine fibroids. Treatment planning. EXAM: MRI PELVIS WITHOUT AND WITH CONTRAST TECHNIQUE: Multiplanar multisequence  MR imaging of the pelvis was performed both before and after administration of intravenous contrast. CONTRAST:  84m GADAVIST GADOBUTROL 1 MMOL/ML IV SOLN COMPARISON:  None Available. FINDINGS: Lower Urinary Tract: No bladder or urethral abnormality identified. Bowel:  Unremarkable visualized pelvic bowel loops. Vascular/Lymphatic: No pathologically enlarged lymph nodes or other significant abnormality. Reproductive: -- Uterus: Measures 16.1 by 11.0 by 15.2 cm (volume = 1410 cm^3). A dominant fibroid is seen in the anterior uterine corpus which has both intramural and submucosal components, and measures 11.1 cm in maximum diameter. This fibroid shows heterogeneous internal edema, consistent with degeneration. A few other tiny intramural fibroids are seen measuring up to 2 cm in the right lateral uterine wall. -- Intracavitary fibroids:  None. -- Pedunculated fibroids: A pedunculated fibroid is seen arising from the right lateral upper uterine corpus which measures 4.8 cm in maximum diameter. Soft tissue pedicle of attachment to the uterus measures approximately 3.3 cm in thickness. -- Fibroid contrast enhancement: All fibroids show contrast enhancement. The dominant anterior uterine fibroid showing internal edema and heterogeneous enhancement consistent with degeneration. -- Right ovary:  Appears normal.  No mass identified. -- Left ovary:  Appears normal.  No mass identified. Other: Tiny amount of free fluid in pelvic cul-de-sac. Musculoskeletal:  Unremarkable. IMPRESSION: Markedly enlarged uterus with dominant 11.1 cm fibroid in the anterior uterine corpus, with  large submucosal component. This fibroid shows heterogeneous enhancement and internal edema, consistent with degeneration. 4.8 cm pedunculated fibroid arising from the right lateral upper uterine corpus, with soft pedicle measuring 3.3 cm in thickness. No intracavitary fibroids identified. Normal appearance of both ovaries. No adnexal mass identified.  Electronically Signed   By: Marlaine Hind M.D.   On: 03/24/2022 16:24       Assessment & Plan:   1. Menorrhagia with regular cycle Discussion of plan for surgical management. Has gotten lupron and will be starting IV iron transfusion. Add on megace to help with current bleeding as well. Uterus is quite large with dominant fibroid, will inquire regarding difference in cost and timing based on [facility] financial. Additionally, current plan to start with robotic platform with possible mini-lap incision for removal of specimen vs open hysterectomy.   - megestrol (MEGACE) 40 MG tablet; Take 1 tablet (40 mg total) by mouth 2 (two) times daily. Can increase to two tablets twice a day in the event of heavy bleeding  Dispense: 60 tablet; Refill: 5  Patient desires surgical management with RA-TLH, BS, cysto possible open.  The risks of surgery were discussed in detail with the patient including but not limited to: bleeding which may require transfusion or reoperation; infection which may require prolonged hospitalization or re-hospitalization and antibiotic therapy; injury to bowel, bladder, ureters and major vessels or other surrounding organs which may lead to other procedures; formation of adhesions; need for additional procedures including laparotomy or subsequent procedures secondary to intraoperative injury or abnormal pathology; thromboembolic phenomenon; incisional problems and other postoperative or anesthesia complications.  Patient was told that the likelihood that her condition and symptoms will be treated effectively with this surgical management was very high; the postoperative expectations were also discussed in detail. The patient also understands the alternative treatment options which were discussed in full. All questions were answered.  She was told that she will be contacted by our surgical scheduler regarding the time and date of her surgery; routine preoperative instructions will be given to her by  the preoperative nursing team.  Printed patient education handouts about the procedure were given to the patient to review at home.   2. Language barrier In person Spanish interpreter    Routine preventative health maintenance measures emphasized.  Darliss Cheney, MD [facility] [place]

## 2022-04-15 ENCOUNTER — Ambulatory Visit: Payer: Self-pay

## 2022-04-17 ENCOUNTER — Other Ambulatory Visit: Payer: Self-pay

## 2022-04-17 ENCOUNTER — Inpatient Hospital Stay: Payer: Self-pay | Attending: Hematology

## 2022-04-17 VITALS — BP 130/76 | HR 67 | Temp 98.7°F | Resp 21

## 2022-04-17 DIAGNOSIS — D500 Iron deficiency anemia secondary to blood loss (chronic): Secondary | ICD-10-CM

## 2022-04-17 DIAGNOSIS — N920 Excessive and frequent menstruation with regular cycle: Secondary | ICD-10-CM

## 2022-04-17 MED ORDER — ACETAMINOPHEN 325 MG PO TABS
650.0000 mg | ORAL_TABLET | Freq: Once | ORAL | Status: AC
  Administered 2022-04-17: 650 mg via ORAL
  Filled 2022-04-17: qty 2

## 2022-04-17 MED ORDER — SODIUM CHLORIDE 0.9 % IV SOLN: Freq: Once | INTRAVENOUS | Status: AC

## 2022-04-17 MED ORDER — SODIUM CHLORIDE 0.9 % IV SOLN
300.0000 mg | Freq: Once | INTRAVENOUS | Status: AC
  Administered 2022-04-17: 300 mg via INTRAVENOUS
  Filled 2022-04-17: qty 10

## 2022-04-17 MED ORDER — LORATADINE 10 MG PO TABS
10.0000 mg | ORAL_TABLET | Freq: Once | ORAL | Status: AC
  Administered 2022-04-17: 10 mg via ORAL
  Filled 2022-04-17: qty 1

## 2022-04-17 NOTE — Patient Instructions (Signed)
Iron Sucrose Injection Qu es este medicamento? El HIERRO SACAROSA trata los niveles bajos de hierro (anemia por deficiencia de Sport and exercise psychologist) en personas con enfermedad renal. El hierro es un mineral que cumple una funcin importante en la produccin de glbulos rojos, que llevan el oxgeno de los pulmones al resto del cuerpo. Este medicamento puede ser utilizado para otros usos; si tiene alguna pregunta consulte con su proveedor de atencin mdica o con su farmacutico. MARCAS COMUNES: Venofer Qu le debo informar a mi profesional de la salud antes de tomar este medicamento? Necesitan saber si usted presenta alguno de los [employer] o situaciones: Anemia no causada por niveles bajos de hierro Engineer, building services altos de hierro en la sangre Enfermedad renal Enfermedad heptica Una reaccin alrgica o inusual al hierro, a otros medicamentos, alimentos, colorantes o conservantes Si est embarazada o buscando quedar embarazada Si est amamantando a un beb Cmo debo Insurance account manager medicamento? Este medicamento se administra mediante infusin en una vena. Se administra en un hospital o en un entorno clnico. Hable con su equipo de atencin sobre el uso de este medicamento en nios. Aunque este medicamento se puede recetar a nios tan pequeos como de 2 aos de edad con ciertas afecciones, existen precauciones que deben tomarse. Sobredosis: Pngase en contacto inmediatamente con un centro toxicolgico o una sala de urgencia si usted cree que haya tomado demasiado medicamento. ATENCIN: [employer] es solo para usted. No comparta este medicamento con nadie. Qu sucede si me olvido de una dosis? Cumpla con las citas para dosis de seguimiento. Es importante no olvidar ninguna dosis. Llame a su equipo de atencin si no puede asistir a una cita. Qu puede interactuar con este medicamento? No use este medicamento con ninguno de los siguientes  productos: Deferoxamina Dimercaprol Otros productos con hierro Fish farm manager tambin podra Counselling psychologist con los siguientes productos: Cloranfenicol Deferasirox Puede ser que esta lista no menciona todas las posibles interacciones. Informe a su profesional de [employer]	[place] de [employer] productos a base de hierbas, medicamentos de [place] o suplementos nutritivos que est tomando. Si usted fuma, consume bebidas alcohlicas o si utiliza drogas ilegales, indqueselo tambin a su profesional de [employer]	[place]. Algunas sustancias pueden interactuar con su medicamento. A qu debo estar atento al usar [employer]? Visite peridicamente a su equipo de atencin. Si los sntomas no comienzan a mejorar o si empeoran, informe a su equipo de atencin. Usted podra necesitar realizarse [employer] de sangre mientras est usando [place]. Es posible que deba seguir una dieta especial. Hable con su equipo de atencin. Los alimentos que contienen hierro incluyen: granos enteros/cereales, frutas secas, legumbres, o guisantes, verduras de hojas verdes y asaduras (hgado, rin). Qu efectos secundarios puedo tener al [employer] este medicamento? Efectos secundarios que debe informar a su equipo de atencin tan pronto como sea posible: Reacciones alrgicas: erupcin cutnea, comezn/picazn, urticaria, hinchazn de la cara, los labios, la lengua o la garganta Presin arterial baja: mareo, sensacin de desmayo o aturdimiento, visin borrosa Falta de aliento Efectos secundarios que generalmente no requieren atencin mdica (debe informarlos a su equipo de atencin si persisten o si son molestos): Enrojecimiento Dolor de Special educational needs teacher en las articulaciones Dolor muscular Audiological scientist, enrojecimiento o Actor de la inyeccin Puede ser que esta lista no menciona todos los posibles efectos secundarios. Comunquese a su mdico por asesoramiento mdico [employer]. Usted puede  informar los efectos secundarios a la FDA por telfono al 1-800-FDA-1088. Dnde debo guardar mi medicina?  Este medicamento se administra en hospitales o clnicas y no es necesario guardarlo en su domicilio. ATENCIN: Este folleto es un resumen. Puede ser que no cubra toda la posible informacin. Si usted tiene preguntas acerca de esta medicina, consulte con su mdico, su farmacutico o su profesional de Technical sales engineer.  2023 Elsevier/Gold Standard (2021-12-24 00:00:00)

## 2022-04-17 NOTE — Progress Notes (Signed)
Patient remained for 30 min post obs.  VSS at discharge. Tolerated treatment well without incident.  Ambulated to lobby.

## 2022-04-20 ENCOUNTER — Telehealth: Payer: Self-pay | Admitting: Lactation Services

## 2022-04-20 NOTE — Telephone Encounter (Addendum)
Called patient to inform her of information below. She did not answer. LM for patient to call the office at her convenience.   ----- Message from Darliss Cheney, MD sent at 04/16/2022  3:50 PM EST ----- Regarding: upcoming surgery Can someone pass along plan will remain for a robotic surgery for her hysterectomy? She will also be getting medication to help numb her belly before the surgery to help with post-surgical pain.  Thank you! C Ajewole

## 2022-04-21 ENCOUNTER — Other Ambulatory Visit: Payer: Self-pay

## 2022-04-21 NOTE — Telephone Encounter (Signed)
Called pt with interpreter Claudia. VM left. Letter has been sent previously with surgery details on 04/09/22.

## 2022-04-21 NOTE — Telephone Encounter (Signed)
Pt returned call. All information reviewed.

## 2022-04-22 ENCOUNTER — Ambulatory Visit: Payer: Self-pay

## 2022-04-22 ENCOUNTER — Encounter: Payer: Self-pay | Admitting: Hematology

## 2022-04-22 ENCOUNTER — Encounter: Payer: Self-pay | Admitting: Critical Care Medicine

## 2022-04-22 ENCOUNTER — Other Ambulatory Visit: Payer: Self-pay

## 2022-04-24 ENCOUNTER — Inpatient Hospital Stay: Payer: Self-pay

## 2022-04-24 ENCOUNTER — Other Ambulatory Visit: Payer: Self-pay

## 2022-04-24 VITALS — BP 133/77 | HR 71 | Temp 98.2°F | Resp 18

## 2022-04-24 DIAGNOSIS — N920 Excessive and frequent menstruation with regular cycle: Secondary | ICD-10-CM

## 2022-04-24 DIAGNOSIS — D500 Iron deficiency anemia secondary to blood loss (chronic): Secondary | ICD-10-CM

## 2022-04-24 MED ORDER — ACETAMINOPHEN 325 MG PO TABS
650.0000 mg | ORAL_TABLET | Freq: Once | ORAL | Status: AC
  Administered 2022-04-24: 650 mg via ORAL
  Filled 2022-04-24: qty 2

## 2022-04-24 MED ORDER — SODIUM CHLORIDE 0.9 % IV SOLN: Freq: Once | INTRAVENOUS | Status: AC

## 2022-04-24 MED ORDER — SODIUM CHLORIDE 0.9 % IV SOLN
300.0000 mg | Freq: Once | INTRAVENOUS | Status: AC
  Administered 2022-04-24: 300 mg via INTRAVENOUS
  Filled 2022-04-24: qty 300

## 2022-04-24 MED ORDER — LORATADINE 10 MG PO TABS
10.0000 mg | ORAL_TABLET | Freq: Once | ORAL | Status: AC
  Administered 2022-04-24: 10 mg via ORAL
  Filled 2022-04-24: qty 1

## 2022-04-24 NOTE — Patient Instructions (Signed)
Iron Sucrose Injection Qu es este medicamento? El HIERRO SACAROSA trata los niveles bajos de hierro (anemia por deficiencia de hierro) en personas con enfermedad renal. El hierro es un mineral que cumple una funcin importante en la produccin de glbulos rojos, que llevan el oxgeno de los pulmones al resto del cuerpo. Este medicamento puede ser utilizado para otros usos; si tiene alguna pregunta consulte con su proveedor de atencin mdica o con su farmacutico. MARCAS COMUNES: Venofer Qu le debo informar a mi profesional de la salud antes de tomar este medicamento? Necesitan saber si usted presenta alguno de los siguientes problemas o situaciones: Anemia no causada por niveles bajos de hierro Enfermedad cardiaca Niveles altos de hierro en la sangre Enfermedad renal Enfermedad heptica Una reaccin alrgica o inusual al hierro, a otros medicamentos, alimentos, colorantes o conservantes Si est embarazada o buscando quedar embarazada Si est amamantando a un beb Cmo debo utilizar este medicamento? Este medicamento se administra mediante infusin en una vena. Se administra en un hospital o en un entorno clnico. Hable con su equipo de atencin sobre el uso de este medicamento en nios. Aunque este medicamento se puede recetar a nios tan pequeos como de 2 aos de edad con ciertas afecciones, existen precauciones que deben tomarse. Sobredosis: Pngase en contacto inmediatamente con un centro toxicolgico o una sala de urgencia si usted cree que haya tomado demasiado medicamento. ATENCIN: Este medicamento es solo para usted. No comparta este medicamento con nadie. Qu sucede si me olvido de una dosis? Cumpla con las citas para dosis de seguimiento. Es importante no olvidar ninguna dosis. Llame a su equipo de atencin si no puede asistir a una cita. Qu puede interactuar con este medicamento? No use este medicamento con ninguno de los siguientes  productos: Deferoxamina Dimercaprol Otros productos con hierro Este medicamento tambin podra interactuar con los siguientes productos: Cloranfenicol Deferasirox Puede ser que esta lista no menciona todas las posibles interacciones. Informe a su profesional de la salud de todos los productos a base de hierbas, medicamentos de venta libre o suplementos nutritivos que est tomando. Si usted fuma, consume bebidas alcohlicas o si utiliza drogas ilegales, indqueselo tambin a su profesional de la salud. Algunas sustancias pueden interactuar con su medicamento. A qu debo estar atento al usar este medicamento? Visite peridicamente a su equipo de atencin. Si los sntomas no comienzan a mejorar o si empeoran, informe a su equipo de atencin. Usted podra necesitar realizarse anlisis de sangre mientras est usando este medicamento. Es posible que deba seguir una dieta especial. Hable con su equipo de atencin. Los alimentos que contienen hierro incluyen: granos enteros/cereales, frutas secas, legumbres, o guisantes, verduras de hojas verdes y asaduras (hgado, rin). Qu efectos secundarios puedo tener al utilizar este medicamento? Efectos secundarios que debe informar a su equipo de atencin tan pronto como sea posible: Reacciones alrgicas: erupcin cutnea, comezn/picazn, urticaria, hinchazn de la cara, los labios, la lengua o la garganta Presin arterial baja: mareo, sensacin de desmayo o aturdimiento, visin borrosa Falta de aliento Efectos secundarios que generalmente no requieren atencin mdica (debe informarlos a su equipo de atencin si persisten o si son molestos): Enrojecimiento Dolor de cabeza Dolor en las articulaciones Dolor muscular Nuseas Dolor, enrojecimiento o irritacin en el lugar de la inyeccin Puede ser que esta lista no menciona todos los posibles efectos secundarios. Comunquese a su mdico por asesoramiento mdico sobre los efectos secundarios. Usted puede  informar los efectos secundarios a la FDA por telfono al 1-800-FDA-1088. Dnde debo guardar mi medicina?   Este medicamento se administra en hospitales o clnicas y no es necesario guardarlo en su domicilio. ATENCIN: Este folleto es un resumen. Puede ser que no cubra toda la posible informacin. Si usted tiene preguntas acerca de esta medicina, consulte con su mdico, su farmacutico o su profesional de la salud.  2023 Elsevier/Gold Standard (2021-12-24 00:00:00)  

## 2022-04-24 NOTE — Progress Notes (Signed)
Patient tolerated iron infusion well. VSS at discharge. Patient declined to stay 30 minute post observation.

## 2022-04-27 ENCOUNTER — Other Ambulatory Visit: Payer: Self-pay

## 2022-04-29 ENCOUNTER — Ambulatory Visit: Payer: Self-pay

## 2022-04-30 ENCOUNTER — Other Ambulatory Visit: Payer: Self-pay

## 2022-04-30 DIAGNOSIS — D500 Iron deficiency anemia secondary to blood loss (chronic): Secondary | ICD-10-CM

## 2022-05-01 ENCOUNTER — Inpatient Hospital Stay: Payer: Self-pay

## 2022-05-01 ENCOUNTER — Inpatient Hospital Stay (HOSPITAL_BASED_OUTPATIENT_CLINIC_OR_DEPARTMENT_OTHER): Payer: Self-pay | Admitting: Hematology

## 2022-05-01 ENCOUNTER — Other Ambulatory Visit: Payer: Self-pay

## 2022-05-01 ENCOUNTER — Encounter: Payer: Self-pay | Admitting: Hematology

## 2022-05-01 VITALS — BP 134/76 | HR 71 | Temp 97.3°F | Resp 18 | Wt 152.3 lb

## 2022-05-01 VITALS — BP 152/83 | HR 64 | Temp 98.8°F | Resp 18

## 2022-05-01 DIAGNOSIS — D500 Iron deficiency anemia secondary to blood loss (chronic): Secondary | ICD-10-CM

## 2022-05-01 DIAGNOSIS — N920 Excessive and frequent menstruation with regular cycle: Secondary | ICD-10-CM

## 2022-05-01 LAB — CMP (CANCER CENTER ONLY)
ALT: 14 U/L (ref 0–44)
AST: 15 U/L (ref 15–41)
Albumin: 4.6 g/dL (ref 3.5–5.0)
Alkaline Phosphatase: 49 U/L (ref 38–126)
Anion gap: 5 (ref 5–15)
BUN: 14 mg/dL (ref 6–20)
CO2: 27 mmol/L (ref 22–32)
Calcium: 9 mg/dL (ref 8.9–10.3)
Chloride: 106 mmol/L (ref 98–111)
Creatinine: 0.54 mg/dL (ref 0.44–1.00)
GFR, Estimated: >60 mL/min (ref >60)
Glucose, Bld: 95 mg/dL (ref 70–99)
Potassium: 3.6 mmol/L (ref 3.5–5.1)
Sodium: 138 mmol/L (ref 135–145)
Total Bilirubin: 0.4 mg/dL (ref 0.3–1.2)
Total Protein: 7.3 g/dL (ref 6.5–8.1)

## 2022-05-01 LAB — CBC WITH DIFFERENTIAL (CANCER CENTER ONLY)
Abs Immature Granulocytes: 0 10*3/uL (ref 0.00–0.07)
Basophils Absolute: 0 10*3/uL (ref 0.0–0.1)
Basophils Relative: 1 %
Eosinophils Absolute: 0.2 10*3/uL (ref 0.0–0.5)
Eosinophils Relative: 4 %
HCT: 34.5 % — ABNORMAL LOW (ref 36.0–46.0)
Hemoglobin: 10.5 g/dL — ABNORMAL LOW (ref 12.0–15.0)
Immature Granulocytes: 0 %
Lymphocytes Relative: 32 %
Lymphs Abs: 1.8 10*3/uL (ref 0.7–4.0)
MCH: 24.7 pg — ABNORMAL LOW (ref 26.0–34.0)
MCHC: 30.4 g/dL (ref 30.0–36.0)
MCV: 81.2 fL (ref 80.0–100.0)
Monocytes Absolute: 0.4 10*3/uL (ref 0.1–1.0)
Monocytes Relative: 7 %
Neutro Abs: 3.2 10*3/uL (ref 1.7–7.7)
Neutrophils Relative %: 56 %
Platelet Count: 359 10*3/uL (ref 150–400)
RBC: 4.25 MIL/uL (ref 3.87–5.11)
RDW: 24 % — ABNORMAL HIGH (ref 11.5–15.5)
WBC Count: 5.5 10*3/uL (ref 4.0–10.5)
nRBC: 0 % (ref 0.0–0.2)

## 2022-05-01 LAB — IRON AND IRON BINDING CAPACITY (CC-WL,HP ONLY)
Iron: 53 ug/dL (ref 28–170)
Saturation Ratios: 16 % (ref 10.4–31.8)
TIBC: 342 ug/dL (ref 250–450)
UIBC: 289 ug/dL (ref 148–442)

## 2022-05-01 LAB — VITAMIN B12: Vitamin B-12: 564 pg/mL (ref 180–914)

## 2022-05-01 LAB — FERRITIN: Ferritin: 116 ng/mL (ref 11–307)

## 2022-05-01 MED ORDER — LORATADINE 10 MG PO TABS
10.0000 mg | ORAL_TABLET | Freq: Once | ORAL | Status: AC
  Administered 2022-05-01: 10 mg via ORAL
  Filled 2022-05-01: qty 1

## 2022-05-01 MED ORDER — SODIUM CHLORIDE 0.9 % IV SOLN: INTRAVENOUS | Status: DC

## 2022-05-01 MED ORDER — ACETAMINOPHEN 325 MG PO TABS
650.0000 mg | ORAL_TABLET | Freq: Once | ORAL | Status: AC
  Administered 2022-05-01: 650 mg via ORAL
  Filled 2022-05-01: qty 2

## 2022-05-01 MED ORDER — SODIUM CHLORIDE 0.9 % IV SOLN
300.0000 mg | Freq: Once | INTRAVENOUS | Status: AC
  Administered 2022-05-01: 300 mg via INTRAVENOUS
  Filled 2022-05-01: qty 300

## 2022-05-01 NOTE — Patient Instructions (Signed)
Iron Sucrose Injection Qu es este medicamento? El HIERRO SACAROSA trata los niveles bajos de hierro (anemia por deficiencia de hierro) en personas con enfermedad renal. El hierro es un mineral que cumple una funcin importante en la produccin de glbulos rojos, que llevan el oxgeno de los pulmones al resto del cuerpo. Este medicamento puede ser utilizado para otros usos; si tiene alguna pregunta consulte con su proveedor de atencin mdica o con su farmacutico. MARCAS COMUNES: Venofer Qu le debo informar a mi profesional de la salud antes de tomar este medicamento? Necesitan saber si usted presenta alguno de los siguientes problemas o situaciones: Anemia no causada por niveles bajos de hierro Enfermedad cardiaca Niveles altos de hierro en la sangre Enfermedad renal Enfermedad heptica Una reaccin alrgica o inusual al hierro, a otros medicamentos, alimentos, colorantes o conservantes Si est embarazada o buscando quedar embarazada Si est amamantando a un beb Cmo debo utilizar este medicamento? Este medicamento se administra mediante infusin en una vena. Se administra en un hospital o en un entorno clnico. Hable con su equipo de atencin sobre el uso de este medicamento en nios. Aunque este medicamento se puede recetar a nios tan pequeos como de 2 aos de edad con ciertas afecciones, existen precauciones que deben tomarse. Sobredosis: Pngase en contacto inmediatamente con un centro toxicolgico o una sala de urgencia si usted cree que haya tomado demasiado medicamento. ATENCIN: Este medicamento es solo para usted. No comparta este medicamento con nadie. Qu sucede si me olvido de una dosis? Cumpla con las citas para dosis de seguimiento. Es importante no olvidar ninguna dosis. Llame a su equipo de atencin si no puede asistir a una cita. Qu puede interactuar con este medicamento? No use este medicamento con ninguno de los siguientes  productos: Deferoxamina Dimercaprol Otros productos con hierro Este medicamento tambin podra interactuar con los siguientes productos: Cloranfenicol Deferasirox Puede ser que esta lista no menciona todas las posibles interacciones. Informe a su profesional de la salud de todos los productos a base de hierbas, medicamentos de venta libre o suplementos nutritivos que est tomando. Si usted fuma, consume bebidas alcohlicas o si utiliza drogas ilegales, indqueselo tambin a su profesional de la salud. Algunas sustancias pueden interactuar con su medicamento. A qu debo estar atento al usar este medicamento? Visite peridicamente a su equipo de atencin. Si los sntomas no comienzan a mejorar o si empeoran, informe a su equipo de atencin. Usted podra necesitar realizarse anlisis de sangre mientras est usando este medicamento. Es posible que deba seguir una dieta especial. Hable con su equipo de atencin. Los alimentos que contienen hierro incluyen: granos enteros/cereales, frutas secas, legumbres, o guisantes, verduras de hojas verdes y asaduras (hgado, rin). Qu efectos secundarios puedo tener al utilizar este medicamento? Efectos secundarios que debe informar a su equipo de atencin tan pronto como sea posible: Reacciones alrgicas: erupcin cutnea, comezn/picazn, urticaria, hinchazn de la cara, los labios, la lengua o la garganta Presin arterial baja: mareo, sensacin de desmayo o aturdimiento, visin borrosa Falta de aliento Efectos secundarios que generalmente no requieren atencin mdica (debe informarlos a su equipo de atencin si persisten o si son molestos): Enrojecimiento Dolor de cabeza Dolor en las articulaciones Dolor muscular Nuseas Dolor, enrojecimiento o irritacin en el lugar de la inyeccin Puede ser que esta lista no menciona todos los posibles efectos secundarios. Comunquese a su mdico por asesoramiento mdico sobre los efectos secundarios. Usted puede  informar los efectos secundarios a la FDA por telfono al 1-800-FDA-1088. Dnde debo guardar mi medicina?   Este medicamento se administra en hospitales o clnicas y no es necesario guardarlo en su domicilio. ATENCIN: Este folleto es un resumen. Puede ser que no cubra toda la posible informacin. Si usted tiene preguntas acerca de esta medicina, consulte con su mdico, su farmacutico o su profesional de la salud.  2023 Elsevier/Gold Standard (2021-12-24 00:00:00)  

## 2022-05-01 NOTE — Progress Notes (Signed)
Pt tolerated Venofer infusion well. Pt stayed for 30 min observation, Vital Signs Stable and Pt. Ambulatory at  Discharge.

## 2022-05-01 NOTE — Progress Notes (Signed)
[facility]   HEMATOLOGY/ONCOLOGY CLINIC NOTE  Date of Service: 05/01/2022  Patient Care Team: Elsie Stain, MD as PCP - General (Pulmonary Disease) Brunetta Genera, MD as Consulting Physician (Hematology)  CHIEF COMPLAINTS/PURPOSE OF CONSULTATION:  Evaluation and management of Iron deficiency anemia  HISTORY OF PRESENTING ILLNESS:  Tracey Gill is a wonderful 42 y.o. female who has been referred to [place] by Dr Freeman Caldron PA-C for evaluation and management of iron deficiency anemia and consideration of IV iron.  INTERVAL HISTORY:  Tracey Gill is a 42 y.o. female here for continued evaluation and management of Iron deficiency anemia. Patient was last seen by me on 04/04/2022 and complained of heavy periods due to large uterine fibroid. She also endorsed significant fatigue, intermittent lightheadedness, and PICA symptoms such as mud cravings.   Today, she is accompanied by an interpreter. She reports that she has felt less dizzy in general and her energy levels have slightly improved. She notes that her dizziness does increase with her menstrual cycles.  She reports that her last menstrual cycle was 2-3 weeks ago and was heavy despite medication intake. She does regularly take vitamin B complex supplements. She reports that she has received one Lupron injection. She did have a period following this and had since been taking tablets.  She denies any reactions/problems with tolerating her IV iron. No skin rashes from IV iron. She does have some lower abdominal pain, but denies any leg swelling.  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  Tobacco Use   Smoking status: Never   Smokeless tobacco: Never  Vaping Use   Vaping Use: Never used  Substance and Sexual Activity   Alcohol use: No   Drug use: No   Sexual activity: Yes  Other Topics Concern   Not on file  Social History Narrative   Not on file   Social Determinants of Health   Financial Resource Strain: Not on file  Food Insecurity: Not on file  Transportation Needs: Not on file  Physical Activity: Not on file  Stress: Not on file  Social Connections: Not on file  Intimate Partner Violence: Not on file    FAMILY HISTORY: Family History  Problem Relation Age of Onset   Diabetes Mother    Diabetes Father    Heart disease Father    Diabetes Sister     ALLERGIES:  has No Known Allergies.  MEDICATIONS:  Current Outpatient Medications  Medication Sig Dispense Refill   amLODipine (NORVASC) 10 MG tablet Take 1 tablet (10 mg total) by mouth daily. To lower blood pressure 90 tablet 1   ferrous sulfate 325 (65 FE) MG tablet Take 1 tablet (325 mg total) by mouth 3 (three) times daily with meals. 90 tablet 1   leuprolide (LUPRON DEPOT, 42-MONTH,) 11.25 MG injection Inject 11.25 mg into the muscle every 3 (three) months. 1 each 0   megestrol (MEGACE) 40 MG tablet Take 1 tablet (40 mg  total) by mouth 2 (two) times daily. Can increase to two tablets twice a day in the event of heavy bleeding 60 tablet 5   neomycin-polymyxin b-dexamethasone (MAXITROL) 3.5-10000-0.1 OINT SMARTSIG:1 Inch(es) In Eye(s) Every Night (Patient not taking: Reported on 04/14/2022)     prednisoLONE acetate (PRED FORTE) 1 % ophthalmic suspension SMARTSIG:In Eye(s) (Patient not taking: Reported on 04/14/2022)     No current facility-administered medications for this visit.    REVIEW OF SYSTEMS:    10 Point review of Systems was done is negative except as noted above.    PHYSICAL EXAMINATION: ECOG PERFORMANCE STATUS: 1 - Symptomatic but completely ambulatory  . Vitals:   05/01/22 1348  BP: 134/76  Pulse: 71  Resp: 18  Temp: (!) 97.3 F (36.3 C)  SpO2: 100%    Filed Weights   05/01/22 1348  Weight: 152 lb 4.8 oz (69.1 kg)    .Body mass index is 26.98 kg/m. GENERAL:alert, in no acute distress and comfortable SKIN: no acute rashes, no significant lesions EYES: conjunctiva are pink and non-injected, sclera anicteric OROPHARYNX: MMM, no exudates, no oropharyngeal erythema or ulceration NECK: supple, no JVD LYMPH:  no palpable lymphadenopathy in the cervical, axillary or inguinal regions LUNGS: clear to auscultation b/l with normal respiratory effort HEART: regular rate & rhythm ABDOMEN:  normoactive bowel sounds , non tender, not distended. Extremity: no pedal edema PSYCH: alert & oriented x 3 with fluent speech NEURO: no focal motor/sensory deficits   LABORATORY DATA:  I have reviewed the data as listed  .    Latest Ref Rng & Units 05/01/2022    1:04 PM 04/04/2022    9:47 AM 03/12/2022   12:03 PM  CBC  WBC 4.0 - 10.5 K/uL 5.5  4.4  7.3   Hemoglobin 12.0 - 15.0 g/dL 10.5  8.2  8.6   Hematocrit 36.0 - 46.0 % 34.5  27.1  27.3   Platelets 150 - 400 K/uL 359  357  363     .    Latest Ref Rng & Units 05/01/2022    1:04 PM 04/04/2022    9:47 AM 01/13/2022    4:11 PM  CMP  Glucose 70 - 99 mg/dL 95  87  105   BUN 6 - 20 mg/dL 14  8  15    Creatinine 0.44 - 1.00 mg/dL 0.54  0.45  0.59   Sodium 135 - 145 mmol/L 138  138  140   Potassium 3.5 - 5.1 mmol/L 3.6  4.0  3.8   Chloride 98 - 111 mmol/L 106  106  105   CO2 22 - 32 mmol/L 27  27  25    Calcium 8.9 - 10.3 mg/dL 9.0  8.8  8.7   Total Protein 6.5 - 8.1 g/dL 7.3  6.4  6.5   Total Bilirubin 0.3 - 1.2 mg/dL 0.4  0.3  <0.2   Alkaline Phos 38 - 126 U/L 49  45  58   AST 15 - 41 U/L 15  10  16    ALT 0 - 44 U/L 14  10  10     . Lab Results  Component Value Date   IRON 10 (L)  04/04/2022   TIBC 347 04/04/2022   IRONPCTSAT 3 (L) 04/04/2022   (Iron and TIBC)  Lab Results  Component Value Date   FERRITIN 4 (L) 04/04/2022     RADIOGRAPHIC STUDIES: I have personally reviewed the radiological images as listed and agreed with the findings in the report. No  results found.  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  Patient is craving mud but has not really consumed this. #4 muscle cramps could be from anemia iron deficiency but will rule out other deficiencies.  PLAN:  -Discussed lab results on 05/01/2022 with patient. CBC showed WBC of 5.5K, hemoglobin improved to 10.5, and platelets of 359K. -patient is scheduled to receive additional iron infusion today.  -continue vitamin B-complex supplements for next couple months  to support accelerated hematopoiesis. -Patient will not need  to continuing receiving IV iron after surgery -answered all of patient's questions regarding concern for lowered iron levels following her next menstrual cycle -patient does have a hysterectomy scheduled for 06/03/2022 -hemoglobin is at goal to proceed with hysterectomy -discussed option to set up patient for two more infusion session to ensure optimal iron levels prior to surgery, patient is agreeable to this option  FOLLOW-UP: Plz schedule for 2 more doses of Venofer 300mg  weekly starting 1 week from 05/01/2022 RTC with Dr Irene Limbo with labs in 3 months  The total time spent in the appointment was 20 minutes* .  All of the patient's questions were answered with apparent satisfaction. The patient knows to call the clinic with any problems, questions or concerns.   Sullivan Lone MD MS AAHIVMS [facility] Hematology/Oncology Physician [facility]  .*Total Encounter Time as defined by the Centers for Medicare and  Medicaid Services includes, in addition to the face-to-face time of a patient visit (documented in the note above) non-face-to-face time: obtaining and reviewing outside history, ordering and reviewing medications, tests or procedures, care coordination (communications with other health care professionals or caregivers) and documentation in the medical record.    I,Mitra Faeizi,acting as a Education administrator for Sullivan Lone, MD.,have documented all relevant documentation on the behalf of Sullivan Lone, MD,as directed by  Sullivan Lone, MD while in the presence of Sullivan Lone, MD.  .I have reviewed the above documentation for accuracy and completeness, and I agree with the above. Brunetta Genera MD

## 2022-05-01 NOTE — Progress Notes (Signed)
Labs reviewed and assessment done by Dr Irene Limbo.  Ok to treat today.

## 2022-05-08 ENCOUNTER — Encounter: Payer: Self-pay | Admitting: Critical Care Medicine

## 2022-05-08 ENCOUNTER — Encounter: Payer: Self-pay | Admitting: Hematology

## 2022-05-10 NOTE — Progress Notes (Unsigned)
Patient ID: Tracey Gill, female   DOB: [birth year], 42 y.o.   MRN: [id number]     Tracey Gill, is a 42 y.o. female  Chief Complaint  Patient presents with   Medication Refill   Hypertension       Subjective:  04/01/22  Saw Tracey Gill :  Tracey Gill is a 42 y.o. female here today for BP follow up and continued dizziness related to menorrhagia.  She is being followed by Tracey Gill for menorrhagia and uterine fibroids-recent MRI and Rx Lupron.  The patient says she is unsure how she is to go about getting the Lupron injection. She has not been told her MRI results but I do see she has a f/up appt with Tracey Gill on 04/14/2022.  she denies PICA currently.  Last Hgb 03/12/2022 and was 8.6(up from 6.3).  she has a letter with her that she has been approved for additional iron infusions.  She is currently taking OTC iron bid but still has dizziness.  No tinnitus.  No SOB. She does say that she felt much better after having an iron infusion a few months ago.    05/12/22 This patient seen in return follow-up visit is assisted by video interpreter Tracey Gill [phone] and then later Tracey Gill [phone]  Patient does arrive with elevated blood pressure 160/92.  She has iron deficiency anemia with menorrhagia she is being scheduled in April for hysterectomy.  She has received multiple iron infusions by hematology below is documentation from the hematology office.  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  Patient is craving mud but has not really consumed this. #4 muscle cramps could be from anemia iron deficiency but will rule out other deficiencies.   PLAN:   -Discussed lab results on 05/01/2022 with patient. CBC showed WBC of 5.5K, hemoglobin improved to 10.5, and platelets of 359K. -patient is scheduled to receive additional iron infusion  today.  -continue vitamin B-complex supplements for next couple months  to support accelerated hematopoiesis. -Patient will not need  to continuing receiving IV iron after surgery -answered all of patient's questions regarding concern for lowered iron levels following her next menstrual cycle -patient does have a hysterectomy scheduled for 06/03/2022 -hemoglobin is at goal to proceed with hysterectomy -discussed option to set up patient for two more infusion session to ensure optimal iron levels prior to surgery, patient is agreeable to this option   FOLLOW-UP: Plz schedule for 2 more doses of Venofer 300mg  weekly starting 1 week from 05/01/2022 RTC with Tracey Gill with labs in 3 months   Patient needs follow-up with hematology as above and also needs follow-up iron levels.  She states her dizziness has improved.   Problem  Iron Deficiency Anemia Due to Chronic Blood Loss  Intramural and Subserous Leiomyoma of Uterus  Essential Hypertension    ALLERGIES: No Known Allergies  PAST MEDICAL HISTORY: Past Medical History:  Diagnosis Date   Anemia    Hypertension    Known health problems: none    Constitutional:   No  weight loss, night sweats,  Fevers, chills, fatigue, lassitude. HEENT:   No headaches,  Difficulty swallowing,  Tooth/dental problems,  Sore throat,                No sneezing, itching, ear ache, nasal congestion, post nasal drip,   CV:  No chest pain,  Orthopnea,  PND, swelling in lower extremities, anasarca, dizziness, palpitations  GI  No heartburn, indigestion, abdominal pain, nausea, vomiting, diarrhea, change in bowel habits, loss of appetite  Resp: No shortness of breath with exertion or at rest.  No excess mucus, no productive cough,  No non-productive cough,  No coughing up of blood.  No change in color of mucus.  No wheezing.  No chest wall deformity  Skin: no rash or lesions.  GU: no dysuria, change in color of urine, no urgency or frequency.  No flank  pain.  MS:  No joint pain or swelling.  No decreased range of motion.  No back pain.  Psych:  No change in mood or affect. No depression or anxiety.  No memory loss.   Objective:   Vitals:   05/12/22 1356  BP: (!) 157/92  Pulse: 63  SpO2: 100%  Weight: 149 lb 6.4 oz (67.8 kg)  Height: 5' (1.524 m)   Exam General appearance : Awake, alert, not in any distress. Speech Clear. Not toxic looking HEENT: Atraumatic and Normocephalic Neck: Supple, no JVD. No cervical lymphadenopathy.  Chest: Good air entry bilaterally, CTAB.  No rales/rhonchi/wheezing CVS: S1 S2 regular, no murmurs.  Extremities: B/L Lower Ext shows no edema, both legs are warm to touch Neurology: Awake alert, and oriented X 3, CN II-XII intact, Non focal Skin: No Rash  Data Review No results found for: "HGBA1C"  Assessment & Plan  I personally reviewed all images and lab data in the [facility] system as well as any outside material available during this office visit and agree with the  radiology impressions.   Essential hypertension Hypertension not controlled plan is to begin valsartan 40 mg daily and continue amlodipine 10 mg daily have patient return short-term follow-up  Intramural and subserous leiomyoma of uterus Planning hysterectomy in April  Iron deficiency anemia due to chronic blood loss Reassess iron level   Davonna was seen today for medication refill and hypertension.  Diagnoses and all orders for this visit:  Essential hypertension  Intramural and subserous leiomyoma of uterus  Iron deficiency anemia due to chronic blood loss  Other orders -     Blood Pressure Monitoring (BLOOD PRESSURE KIT) DEVI; Use to measure blood pressure -     valsartan (DIOVAN) 40 MG tablet; Take 1 tablet (40 mg total) by mouth daily.        Return in about 6 weeks (around 06/23/2022) for htn.

## 2022-05-12 ENCOUNTER — Other Ambulatory Visit: Payer: Self-pay

## 2022-05-12 ENCOUNTER — Encounter: Payer: Self-pay | Admitting: Critical Care Medicine

## 2022-05-12 ENCOUNTER — Ambulatory Visit: Payer: Self-pay | Attending: Critical Care Medicine | Admitting: Critical Care Medicine

## 2022-05-12 ENCOUNTER — Encounter: Payer: Self-pay | Admitting: Hematology

## 2022-05-12 VITALS — BP 157/92 | HR 63 | Ht 60.0 in | Wt 149.4 lb

## 2022-05-12 DIAGNOSIS — D252 Subserosal leiomyoma of uterus: Secondary | ICD-10-CM

## 2022-05-12 DIAGNOSIS — D500 Iron deficiency anemia secondary to blood loss (chronic): Secondary | ICD-10-CM

## 2022-05-12 DIAGNOSIS — D251 Intramural leiomyoma of uterus: Secondary | ICD-10-CM

## 2022-05-12 DIAGNOSIS — I10 Essential (primary) hypertension: Secondary | ICD-10-CM

## 2022-05-12 MED ORDER — BLOOD PRESSURE KIT DEVI
0 refills | Status: DC
  Filled 2022-05-12 – 2022-06-12 (×2): qty 1, fill #0

## 2022-05-12 MED ORDER — VALSARTAN 40 MG PO TABS
40.0000 mg | ORAL_TABLET | Freq: Every day | ORAL | 3 refills | Status: DC
  Filled 2022-05-12: qty 90, 90d supply, fill #0

## 2022-05-12 NOTE — Assessment & Plan Note (Signed)
Hypertension not controlled plan is to begin valsartan 40 mg daily and continue amlodipine 10 mg daily have patient return short-term follow-up

## 2022-05-12 NOTE — Patient Instructions (Addendum)
Start Valsartan 40mg  daily Stay on amlodipine daily Return Dr Joya Gaskins 6 weeks  Iniciar Valsartn 40 mg al da Mantngase con amlodipino diariamente Volver Dr. Joya Gaskins 6 semanas

## 2022-05-12 NOTE — Assessment & Plan Note (Signed)
Planning hysterectomy in April

## 2022-05-12 NOTE — Assessment & Plan Note (Signed)
Reassess iron level 

## 2022-05-13 ENCOUNTER — Other Ambulatory Visit: Payer: Self-pay

## 2022-05-14 ENCOUNTER — Other Ambulatory Visit: Payer: Self-pay | Admitting: Hematology and Oncology

## 2022-05-14 ENCOUNTER — Inpatient Hospital Stay: Payer: Self-pay

## 2022-05-14 ENCOUNTER — Other Ambulatory Visit: Payer: Self-pay

## 2022-05-14 VITALS — BP 153/72 | HR 72 | Temp 99.0°F | Resp 18 | Ht 60.0 in | Wt 148.5 lb

## 2022-05-14 DIAGNOSIS — D500 Iron deficiency anemia secondary to blood loss (chronic): Secondary | ICD-10-CM

## 2022-05-14 DIAGNOSIS — N920 Excessive and frequent menstruation with regular cycle: Secondary | ICD-10-CM

## 2022-05-14 MED ORDER — SODIUM CHLORIDE 0.9 % IV SOLN
300.0000 mg | Freq: Once | INTRAVENOUS | Status: AC
  Administered 2022-05-14: 300 mg via INTRAVENOUS
  Filled 2022-05-14: qty 300

## 2022-05-14 MED ORDER — LORATADINE 10 MG PO TABS
10.0000 mg | ORAL_TABLET | Freq: Once | ORAL | Status: AC
  Administered 2022-05-14: 10 mg via ORAL
  Filled 2022-05-14: qty 1

## 2022-05-14 MED ORDER — SODIUM CHLORIDE 0.9 % IV SOLN: Freq: Once | INTRAVENOUS | Status: AC

## 2022-05-14 MED ORDER — ACETAMINOPHEN 325 MG PO TABS
650.0000 mg | ORAL_TABLET | Freq: Once | ORAL | Status: AC
  Administered 2022-05-14: 650 mg via ORAL
  Filled 2022-05-14: qty 2

## 2022-05-14 NOTE — Patient Instructions (Addendum)
Iron Sucrose Injection What is this medication? IRON SUCROSE (EYE ern SOO krose) treats low levels of iron (iron deficiency anemia) in people with kidney disease. Iron is a mineral that plays an important role in making red blood cells, which carry oxygen from your lungs to the rest of your body. This medicine may be used for other purposes; ask your health care provider or pharmacist if you have questions. COMMON BRAND NAME(S): Venofer What should I tell my care team before I take this medication? They need to know if you have any of these conditions: Anemia not caused by low iron levels Heart disease High levels of iron in the blood Kidney disease Liver disease An unusual or allergic reaction to iron, other medications, foods, dyes, or preservatives Pregnant or trying to get pregnant Breastfeeding How should I use this medication? This medication is for infusion into a vein. It is given in a hospital or clinic setting. Talk to your care team about the use of this medication in children. While this medication may be prescribed for children as young as 2 years for selected conditions, precautions do apply. Overdosage: If you think you have taken too much of this medicine contact a poison control center or emergency room at once. NOTE: This medicine is only for you. Do not share this medicine with others. What if I miss a dose? Keep appointments for follow-up doses. It is important not to miss your dose. Call your care team if you are unable to keep an appointment. What may interact with this medication? Do not take this medication with any of the following: Deferoxamine Dimercaprol Other iron products This medication may also interact with the following: Chloramphenicol Deferasirox This list may not describe all possible interactions. Give your health care provider a list of all the medicines, herbs, non-prescription drugs, or dietary supplements you use. Also tell them if you smoke,  drink alcohol, or use illegal drugs. Some items may interact with your medicine. What should I watch for while using this medication? Visit your care team regularly. Tell your care team if your symptoms do not start to get better or if they get worse. You may need blood work done while you are taking this medication. You may need to follow a special diet. Talk to your care team. Foods that contain iron include: whole grains/cereals, dried fruits, beans, or peas, leafy green vegetables, and organ meats (liver, kidney). What side effects may I notice from receiving this medication? Side effects that you should report to your care team as soon as possible: Allergic reactions--skin rash, itching, hives, swelling of the face, lips, tongue, or throat Low blood pressure--dizziness, feeling faint or lightheaded, blurry vision Shortness of breath Side effects that usually do not require medical attention (report to your care team if they continue or are bothersome): Flushing Headache Joint pain Muscle pain Nausea Pain, redness, or irritation at injection site This list may not describe all possible side effects. Call your doctor for medical advice about side effects. You may report side effects to FDA at 1-800-FDA-1088. Where should I keep my medication? This medication is given in a hospital or clinic and will not be stored at home. NOTE: This sheet is a summary. It may not cover all possible information. If you have questions about this medicine, talk to your doctor, pharmacist, or health care provider.  2023 Elsevier/Gold Standard (2020-05-16 00:00:00) Iron Sucrose Injection Qu es este medicamento? El HIERRO SACAROSA trata los niveles bajos de hierro (anemia por  deficiencia de hierro) en personas con enfermedad renal. El hierro es un mineral que cumple una funcin importante en la produccin de glbulos rojos, que llevan el oxgeno de los pulmones al resto del cuerpo. Este medicamento puede ser  utilizado para otros usos; si tiene alguna pregunta consulte con su proveedor de atencin mdica o con su farmacutico. MARCAS COMUNES: Venofer Qu le debo informar a mi profesional de la salud antes de tomar este medicamento? Necesitan saber si usted presenta alguno de los [employer] o situaciones: Anemia no causada por niveles bajos de hierro Engineer, building services altos de hierro en la sangre Enfermedad renal Enfermedad heptica Una reaccin alrgica o inusual al hierro, a otros medicamentos, alimentos, colorantes o conservantes Si est embarazada o buscando quedar embarazada Si est amamantando a un beb Cmo debo Insurance account manager medicamento? Este medicamento se administra mediante infusin en una vena. Se administra en un hospital o en un entorno clnico. Hable con su equipo de atencin sobre el uso de este medicamento en nios. Aunque este medicamento se puede recetar a nios tan pequeos como de 2 aos de edad con ciertas afecciones, existen precauciones que deben tomarse. Sobredosis: Pngase en contacto inmediatamente con un centro toxicolgico o una sala de urgencia si usted cree que haya tomado demasiado medicamento. ATENCIN: [employer] es solo para usted. No comparta este medicamento con nadie. Qu sucede si me olvido de una dosis? Cumpla con las citas para dosis de seguimiento. Es importante no olvidar ninguna dosis. Llame a su equipo de atencin si no puede asistir a una cita. Qu puede interactuar con este medicamento? No use este medicamento con ninguno de los siguientes productos: Deferoxamina Dimercaprol Otros productos con hierro Fish farm manager tambin podra Counselling psychologist con los siguientes productos: Cloranfenicol Deferasirox Puede ser que esta lista no menciona todas las posibles interacciones. Informe a su profesional de [employer]	[place] de [employer] productos a base de hierbas, medicamentos de [place] o suplementos nutritivos que est tomando. Si  usted fuma, consume bebidas alcohlicas o si utiliza drogas ilegales, indqueselo tambin a su profesional de [employer]	[place]. Algunas sustancias pueden interactuar con su medicamento. A qu debo estar atento al usar [employer]? Visite peridicamente a su equipo de atencin. Si los sntomas no comienzan a mejorar o si empeoran, informe a su equipo de atencin. Usted podra necesitar realizarse [employer] de sangre mientras est usando [place]. Es posible que deba seguir una dieta especial. Hable con su equipo de atencin. Los alimentos que contienen hierro incluyen: granos enteros/cereales, frutas secas, legumbres, o guisantes, verduras de hojas verdes y asaduras (hgado, rin). Qu efectos secundarios puedo tener al [employer] este medicamento? Efectos secundarios que debe informar a su equipo de atencin tan pronto como sea posible: Reacciones alrgicas: erupcin cutnea, comezn/picazn, urticaria, hinchazn de la cara, los labios, la lengua o la garganta Presin arterial baja: mareo, sensacin de desmayo o aturdimiento, visin borrosa Falta de aliento Efectos secundarios que generalmente no requieren atencin mdica (debe informarlos a su equipo de atencin si persisten o si son molestos): Enrojecimiento Dolor de Special educational needs teacher en las articulaciones Dolor muscular Audiological scientist, enrojecimiento o Actor de la inyeccin Puede ser que esta lista no menciona todos los posibles efectos secundarios. Comunquese a su mdico por asesoramiento mdico [employer]. Usted puede informar los efectos secundarios a la FDA por telfono al 1-800-FDA-1088. Dnde debo guardar mi medicina? Este medicamento se administra en hospitales o clnicas y no es necesario guardarlo en su domicilio. ATENCIN: Cindra Presume  folleto es Teacher, adult education. Puede ser que no cubra toda la posible informacin. Si usted tiene preguntas acerca de esta medicina, consulte con su mdico, su farmacutico o su  profesional de Technical sales engineer.  2023 Elsevier/Gold Standard (2021-12-24 00:00:00)

## 2022-05-21 ENCOUNTER — Inpatient Hospital Stay: Payer: Self-pay | Attending: Hematology

## 2022-05-21 ENCOUNTER — Other Ambulatory Visit: Payer: Self-pay

## 2022-05-21 NOTE — Progress Notes (Signed)
Per Dr. Lorenso Courier, patient is not to receive Venofer infusion today. Pt d/c to lobby in stable condition. VS stable, see flowsheet.

## 2022-05-26 ENCOUNTER — Encounter: Payer: Self-pay | Admitting: Hematology and Oncology

## 2022-05-26 ENCOUNTER — Encounter: Payer: Self-pay | Admitting: Critical Care Medicine

## 2022-05-27 NOTE — Progress Notes (Signed)
Surgical Instructions    Your procedure is scheduled on Wednesday, 06/03/22.  Report to [facility] Main Entrance "A" at 6:30 A.M., then check in with the Admitting office.  Call this number if you have problems the morning of surgery:  [phone]   If you have any questions prior to your surgery date call [phone]: Open Monday-Friday 8am-4pm If you experience any cold or flu symptoms such as cough, fever, chills, shortness of breath, etc. between now and your scheduled surgery, please notify us at the above number     Remember:  Do not eat after midnight the night before your surgery  You may drink clear liquids until 5:30am the morning of your surgery.   Clear liquids allowed are: Water, Non-Citrus Juices (without pulp), Carbonated Beverages, Clear Tea, Black Coffee ONLY (NO MILK, CREAM OR POWDERED CREAMER of any kind), and Gatorade    Take these medicines the morning of surgery with A SIP OF WATER:  amLODipine (NORVASC)  megestrol (MEGACE)   As of today, STOP taking any Aspirin (unless otherwise instructed by your surgeon) Aleve, Naproxen, Ibuprofen, Motrin, Advil, Goody's, BC's, all herbal medications, fish oil, and all vitamins.           Do not wear jewelry or makeup. Do not wear lotions, powders, perfumes or deodorant. Do not shave 48 hours prior to surgery.   Do not bring valuables to the hospital. Do not wear nail polish, gel polish, artificial nails, or any other type of covering on natural nails (fingers and toes) If you have artificial nails or gel coating that need to be removed by a nail salon, please have this removed prior to surgery. Artificial nails or gel coating may interfere with anesthesia's ability to adequately monitor your vital signs.  [facility] is not responsible for any belongings or valuables.    Do NOT Smoke (Tobacco/Vaping)  24 hours prior to your procedure  If you use a CPAP at night, you may bring your mask for your overnight stay.    Contacts, glasses, hearing aids, dentures or partials may not be worn into surgery, please bring cases for these belongings   For patients admitted to the hospital, discharge time will be determined by your treatment team.   Patients discharged the day of surgery will not be allowed to drive home, and someone needs to stay with them for 24 hours.   SURGICAL WAITING ROOM VISITATION Patients having surgery or a procedure may have no more than 2 support people in the waiting area - these visitors may rotate.   Children under the age of 42 must have an adult with them who is not the patient. If the patient needs to stay at the hospital during part of their recovery, the visitor guidelines for inpatient rooms apply. Pre-op nurse will coordinate an appropriate time for 1 support person to accompany patient in pre-op.  This support person may not rotate.   Please refer to [email or web address] for the visitor guidelines for Inpatients (after your surgery is over and you are in a regular room).    Special instructions:    Oral Hygiene is also important to reduce your risk of infection.  Remember - BRUSH YOUR TEETH THE MORNING OF SURGERY WITH YOUR REGULAR TOOTHPASTE   [place]- Preparing For Surgery  Before surgery, you can play an important role. Because skin is not sterile, your skin needs to be as free of germs as possible. You can reduce the number of germs on your skin by  washing with CHG (chlorahexidine gluconate) Soap before surgery.  CHG is an antiseptic cleaner which kills germs and bonds with the skin to continue killing germs even after washing.     Please do not use if you have an allergy to CHG or antibacterial soaps. If your skin becomes reddened/irritated stop using the CHG.  Do not shave (including legs and underarms) for at least 48 hours prior to first CHG shower. It is OK to shave your face.  Please follow these instructions  carefully.     Shower the NIGHT BEFORE SURGERY and the MORNING OF SURGERY with CHG Soap.   If you chose to wash your hair, wash your hair first as usual with your normal shampoo. After you shampoo, rinse your hair and body thoroughly to remove the shampoo.  Then [employer] and genitals (private parts) with your normal soap and rinse thoroughly to remove soap.  After that Use CHG Soap as you would any other liquid soap. You can apply CHG directly to the skin and wash gently with a scrungie or a clean washcloth.   Apply the CHG Soap to your body ONLY FROM THE NECK DOWN.  Do not use on open wounds or open sores. Avoid contact with your eyes, ears, mouth and genitals (private parts). Wash Face and genitals (private parts)  with your normal soap.   Wash thoroughly, paying special attention to the area where your surgery will be performed.  Thoroughly rinse your body with warm water from the neck down.  DO NOT shower/wash with your normal soap after using and rinsing off the CHG Soap.  Pat yourself dry with a CLEAN TOWEL.  Wear CLEAN PAJAMAS to bed the night before surgery  Place CLEAN SHEETS on your bed the night before your surgery  DO NOT SLEEP WITH PETS.   Day of Surgery: Take a shower with CHG soap. Wear Clean/Comfortable clothing the morning of surgery Do not apply any deodorants/lotions.   Remember to brush your teeth WITH YOUR REGULAR TOOTHPASTE.    If you received a COVID test during your pre-op visit, it is requested that you wear a mask when out in public, stay away from anyone that may not be feeling well, and notify your surgeon if you develop symptoms. If you have been in contact with anyone that has tested positive in the last 10 days, please notify your surgeon.    Please read over the following fact sheets that you were given.

## 2022-05-28 ENCOUNTER — Encounter (HOSPITAL_COMMUNITY)
Admission: RE | Admit: 2022-05-28 | Discharge: 2022-05-28 | Disposition: A | Discharge status: Home / Self Care | Payer: Self-pay | Source: Ambulatory Visit | Attending: Obstetrics and Gynecology | Admitting: Obstetrics and Gynecology

## 2022-05-28 ENCOUNTER — Other Ambulatory Visit: Payer: Self-pay

## 2022-05-28 ENCOUNTER — Encounter (HOSPITAL_COMMUNITY): Payer: Self-pay

## 2022-05-28 VITALS — BP 139/86 | HR 71 | Temp 98.2°F | Resp 18 | Ht 60.0 in | Wt 148.9 lb

## 2022-05-28 DIAGNOSIS — I10 Essential (primary) hypertension: Secondary | ICD-10-CM | POA: Insufficient documentation

## 2022-05-28 DIAGNOSIS — Z01818 Encounter for other preprocedural examination: Secondary | ICD-10-CM | POA: Insufficient documentation

## 2022-05-28 DIAGNOSIS — N920 Excessive and frequent menstruation with regular cycle: Secondary | ICD-10-CM | POA: Insufficient documentation

## 2022-05-28 LAB — BASIC METABOLIC PANEL
Anion gap: 8 (ref 5–15)
BUN: 10 mg/dL (ref 6–20)
CO2: 25 mmol/L (ref 22–32)
Calcium: 8.8 mg/dL — ABNORMAL LOW (ref 8.9–10.3)
Chloride: 107 mmol/L (ref 98–111)
Creatinine, Ser: 0.5 mg/dL (ref 0.44–1.00)
GFR, Estimated: >60 mL/min (ref >60)
Glucose, Bld: 109 mg/dL — ABNORMAL HIGH (ref 70–99)
Potassium: 3.1 mmol/L — ABNORMAL LOW (ref 3.5–5.1)
Sodium: 140 mmol/L (ref 135–145)

## 2022-05-28 LAB — CBC
HCT: 34.7 % — ABNORMAL LOW (ref 36.0–46.0)
Hemoglobin: 11.3 g/dL — ABNORMAL LOW (ref 12.0–15.0)
MCH: 26.3 pg (ref 26.0–34.0)
MCHC: 32.6 g/dL (ref 30.0–36.0)
MCV: 80.7 fL (ref 80.0–100.0)
Platelets: 303 10*3/uL (ref 150–400)
RBC: 4.3 MIL/uL (ref 3.87–5.11)
RDW: 23.3 % — ABNORMAL HIGH (ref 11.5–15.5)
WBC: 6 10*3/uL (ref 4.0–10.5)
nRBC: 0 % (ref 0.0–0.2)

## 2022-05-28 LAB — TYPE AND SCREEN
ABO/RH(D): A POS
Antibody Screen: NEGATIVE

## 2022-05-28 NOTE — Progress Notes (Signed)
PCP - Dr. Shan Levans Cardiologist - Denies  PPM/ICD - Denies  Chest x-ray - N/A EKG - 05/28/22 Stress Test - Denies ECHO - Denies Cardiac Cath - Denies  Sleep Study - Denies  Diabetes: Denies  Blood Thinner Instructions: N/A Aspirin Instructions: N/A  ERAS Protcol - Yes PRE-SURGERY Ensure or G2- No  COVID TEST- N/A   Anesthesia review: No  Patient denies shortness of breath, fever, cough and chest pain at PAT appointment   All instructions explained to the patient, with a verbal understanding of the material. Patient agrees to go over the instructions while at home for a better understanding. Patient also instructed to self quarantine after being tested for COVID-19. The opportunity to ask questions was provided.

## 2022-06-03 ENCOUNTER — Ambulatory Visit (HOSPITAL_COMMUNITY): Payer: Self-pay | Admitting: Anesthesiology

## 2022-06-03 ENCOUNTER — Other Ambulatory Visit: Payer: Self-pay

## 2022-06-03 ENCOUNTER — Encounter (HOSPITAL_COMMUNITY)
Admission: RE | Disposition: A | Discharge status: Home / Self Care | Payer: Self-pay | Source: Home / Self Care | Attending: Obstetrics and Gynecology

## 2022-06-03 ENCOUNTER — Encounter (HOSPITAL_COMMUNITY): Payer: Self-pay | Admitting: Obstetrics and Gynecology

## 2022-06-03 ENCOUNTER — Ambulatory Visit (HOSPITAL_BASED_OUTPATIENT_CLINIC_OR_DEPARTMENT_OTHER): Payer: Self-pay | Admitting: Anesthesiology

## 2022-06-03 ENCOUNTER — Inpatient Hospital Stay (HOSPITAL_COMMUNITY)
Admission: RE | Admit: 2022-06-03 | Discharge: 2022-06-05 | DRG: 743 | Disposition: A | Discharge status: Home / Self Care | Payer: Self-pay | Attending: Obstetrics and Gynecology | Admitting: Obstetrics and Gynecology

## 2022-06-03 DIAGNOSIS — Z8249 Family history of ischemic heart disease and other diseases of the circulatory system: Secondary | ICD-10-CM

## 2022-06-03 DIAGNOSIS — Z79899 Other long term (current) drug therapy: Secondary | ICD-10-CM

## 2022-06-03 DIAGNOSIS — Z01818 Encounter for other preprocedural examination: Secondary | ICD-10-CM

## 2022-06-03 DIAGNOSIS — G8918 Other acute postprocedural pain: Secondary | ICD-10-CM | POA: Diagnosis not present

## 2022-06-03 DIAGNOSIS — I10 Essential (primary) hypertension: Secondary | ICD-10-CM | POA: Diagnosis present

## 2022-06-03 DIAGNOSIS — D250 Submucous leiomyoma of uterus: Secondary | ICD-10-CM

## 2022-06-03 DIAGNOSIS — N920 Excessive and frequent menstruation with regular cycle: Principal | ICD-10-CM

## 2022-06-03 DIAGNOSIS — R0602 Shortness of breath: Secondary | ICD-10-CM | POA: Diagnosis not present

## 2022-06-03 DIAGNOSIS — D259 Leiomyoma of uterus, unspecified: Secondary | ICD-10-CM

## 2022-06-03 DIAGNOSIS — N939 Abnormal uterine and vaginal bleeding, unspecified: Principal | ICD-10-CM

## 2022-06-03 DIAGNOSIS — D251 Intramural leiomyoma of uterus: Secondary | ICD-10-CM | POA: Diagnosis present

## 2022-06-03 DIAGNOSIS — D282 Benign neoplasm of uterine tubes and ligaments: Secondary | ICD-10-CM | POA: Diagnosis present

## 2022-06-03 DIAGNOSIS — N856 Intrauterine synechiae: Secondary | ICD-10-CM | POA: Diagnosis present

## 2022-06-03 DIAGNOSIS — Z833 Family history of diabetes mellitus: Secondary | ICD-10-CM

## 2022-06-03 DIAGNOSIS — Z603 Acculturation difficulty: Secondary | ICD-10-CM | POA: Diagnosis present

## 2022-06-03 DIAGNOSIS — D252 Subserosal leiomyoma of uterus: Secondary | ICD-10-CM

## 2022-06-03 DIAGNOSIS — G8929 Other chronic pain: Secondary | ICD-10-CM | POA: Diagnosis present

## 2022-06-03 HISTORY — PX: HYSTERECTOMY ABDOMINAL WITH SALPINGECTOMY: SHX6725

## 2022-06-03 HISTORY — PX: CYSTOSCOPY: SHX5120

## 2022-06-03 HISTORY — PX: ROBOTIC ASSISTED TOTAL HYSTERECTOMY: SHX6085

## 2022-06-03 HISTORY — DX: Submucous leiomyoma of uterus: D25.0

## 2022-06-03 LAB — POCT PREGNANCY, URINE: Preg Test, Ur: NEGATIVE

## 2022-06-03 LAB — ABO/RH: ABO/RH(D): A POS

## 2022-06-03 SURGERY — HYSTERECTOMY, TOTAL, ROBOT-ASSISTED
Anesthesia: General

## 2022-06-03 MED ORDER — ARTIFICIAL TEARS OPHTHALMIC OINT
TOPICAL_OINTMENT | OPHTHALMIC | Status: AC
  Filled 2022-06-03: qty 3.5

## 2022-06-03 MED ORDER — POLYETHYLENE GLYCOL 3350 17 G PO PACK: 17.0000 g | PACK | Freq: Every day | ORAL | Status: DC | PRN

## 2022-06-03 MED ORDER — ROCURONIUM BROMIDE 10 MG/ML (PF) SYRINGE
PREFILLED_SYRINGE | INTRAVENOUS | Status: AC
  Filled 2022-06-03: qty 20

## 2022-06-03 MED ORDER — FENTANYL CITRATE (PF) 100 MCG/2ML IJ SOLN
25.0000 ug | INTRAMUSCULAR | Status: DC | PRN
  Administered 2022-06-03: 50 ug via INTRAVENOUS

## 2022-06-03 MED ORDER — LACTATED RINGERS IV SOLN: INTRAVENOUS | Status: DC

## 2022-06-03 MED ORDER — PHENYLEPHRINE HCL-NACL 20-0.9 MG/250ML-% IV SOLN
INTRAVENOUS | Status: DC | PRN
  Administered 2022-06-03: 15 ug/min via INTRAVENOUS

## 2022-06-03 MED ORDER — ACETAMINOPHEN 10 MG/ML IV SOLN: 1000.0000 mg | Freq: Once | INTRAVENOUS | Status: DC | PRN

## 2022-06-03 MED ORDER — SIMETHICONE 80 MG PO CHEW
80.0000 mg | CHEWABLE_TABLET | Freq: Four times a day (QID) | ORAL | Status: DC | PRN
  Administered 2022-06-04: 80 mg via ORAL
  Filled 2022-06-03: qty 1

## 2022-06-03 MED ORDER — FENTANYL CITRATE (PF) 250 MCG/5ML IJ SOLN
INTRAMUSCULAR | Status: AC
  Filled 2022-06-03: qty 5

## 2022-06-03 MED ORDER — FENTANYL CITRATE (PF) 250 MCG/5ML IJ SOLN
INTRAMUSCULAR | Status: DC | PRN
  Administered 2022-06-03: 50 ug via INTRAVENOUS
  Administered 2022-06-03: 150 ug via INTRAVENOUS
  Administered 2022-06-03 (×2): 50 ug via INTRAVENOUS

## 2022-06-03 MED ORDER — 0.9 % SODIUM CHLORIDE (POUR BTL) OPTIME
TOPICAL | Status: DC | PRN
  Administered 2022-06-03: 1000 mL

## 2022-06-03 MED ORDER — PROPOFOL 1000 MG/100ML IV EMUL
INTRAVENOUS | Status: AC
  Filled 2022-06-03: qty 100

## 2022-06-03 MED ORDER — CEFAZOLIN SODIUM-DEXTROSE 2-4 GM/100ML-% IV SOLN
2.0000 g | INTRAVENOUS | Status: AC
  Administered 2022-06-03 (×2): 2 g via INTRAVENOUS
  Filled 2022-06-03: qty 100

## 2022-06-03 MED ORDER — LIDOCAINE 2% (20 MG/ML) 5 ML SYRINGE
INTRAMUSCULAR | Status: DC | PRN
  Administered 2022-06-03: 60 mg via INTRAVENOUS

## 2022-06-03 MED ORDER — ONDANSETRON HCL 4 MG/2ML IJ SOLN
INTRAMUSCULAR | Status: AC
  Filled 2022-06-03: qty 2

## 2022-06-03 MED ORDER — ONDANSETRON HCL 4 MG/2ML IJ SOLN
INTRAMUSCULAR | Status: DC | PRN
  Administered 2022-06-03: 4 mg via INTRAVENOUS

## 2022-06-03 MED ORDER — CHLORHEXIDINE GLUCONATE 0.12 % MT SOLN
15.0000 mL | Freq: Once | OROMUCOSAL | Status: AC
  Administered 2022-06-03: 15 mL via OROMUCOSAL
  Filled 2022-06-03: qty 15

## 2022-06-03 MED ORDER — ONDANSETRON HCL 4 MG PO TABS: 4.0000 mg | ORAL_TABLET | Freq: Four times a day (QID) | ORAL | Status: DC | PRN

## 2022-06-03 MED ORDER — ACETAMINOPHEN 160 MG/5ML PO SOLN: 1000.0000 mg | Freq: Once | ORAL | Status: DC | PRN

## 2022-06-03 MED ORDER — ACETAMINOPHEN 500 MG PO TABS
1000.0000 mg | ORAL_TABLET | Freq: Four times a day (QID) | ORAL | Status: DC
  Administered 2022-06-03 – 2022-06-05 (×5): 1000 mg via ORAL
  Filled 2022-06-03 (×7): qty 2

## 2022-06-03 MED ORDER — SENNA 8.6 MG PO TABS
1.0000 | ORAL_TABLET | Freq: Two times a day (BID) | ORAL | Status: DC
  Administered 2022-06-04 – 2022-06-05 (×2): 8.6 mg via ORAL
  Filled 2022-06-03 (×3): qty 1

## 2022-06-03 MED ORDER — FLUORESCEIN SODIUM 10 % IV SOLN
INTRAVENOUS | Status: DC | PRN
  Administered 2022-06-03: 10 mg via INTRAVENOUS

## 2022-06-03 MED ORDER — FENTANYL CITRATE (PF) 100 MCG/2ML IJ SOLN
INTRAMUSCULAR | Status: AC
  Filled 2022-06-03: qty 2

## 2022-06-03 MED ORDER — OXYCODONE HCL 5 MG PO TABS: 5.0000 mg | ORAL_TABLET | Freq: Once | ORAL | Status: DC | PRN

## 2022-06-03 MED ORDER — CEFAZOLIN SODIUM 1 G IJ SOLR
INTRAMUSCULAR | Status: AC
  Filled 2022-06-03: qty 20

## 2022-06-03 MED ORDER — ACETAMINOPHEN 500 MG PO TABS
1000.0000 mg | ORAL_TABLET | ORAL | Status: AC
  Administered 2022-06-03: 1000 mg via ORAL
  Filled 2022-06-03: qty 2

## 2022-06-03 MED ORDER — PROPOFOL 500 MG/50ML IV EMUL
INTRAVENOUS | Status: DC | PRN
  Administered 2022-06-03: 25 ug/kg/min via INTRAVENOUS

## 2022-06-03 MED ORDER — HYDROMORPHONE HCL 1 MG/ML IJ SOLN
0.2000 mg | INTRAMUSCULAR | Status: DC | PRN
  Administered 2022-06-03 – 2022-06-04 (×4): 0.6 mg via INTRAVENOUS
  Filled 2022-06-03 (×4): qty 1

## 2022-06-03 MED ORDER — SODIUM CHLORIDE (PF) 0.9 % IJ SOLN
INTRAMUSCULAR | Status: AC
  Filled 2022-06-03: qty 10

## 2022-06-03 MED ORDER — KETOROLAC TROMETHAMINE 30 MG/ML IJ SOLN
INTRAMUSCULAR | Status: AC
  Filled 2022-06-03: qty 1

## 2022-06-03 MED ORDER — DOCUSATE SODIUM 100 MG PO CAPS
100.0000 mg | ORAL_CAPSULE | Freq: Two times a day (BID) | ORAL | Status: DC
  Administered 2022-06-04 – 2022-06-05 (×3): 100 mg via ORAL
  Filled 2022-06-03 (×3): qty 1

## 2022-06-03 MED ORDER — BUPIVACAINE HCL (PF) 0.5 % IJ SOLN
INTRAMUSCULAR | Status: DC | PRN
  Administered 2022-06-03: 10 mL

## 2022-06-03 MED ORDER — OXYCODONE HCL 5 MG/5ML PO SOLN: 5.0000 mg | Freq: Once | ORAL | Status: DC | PRN

## 2022-06-03 MED ORDER — KETOROLAC TROMETHAMINE 15 MG/ML IJ SOLN
30.0000 mg | Freq: Once | INTRAMUSCULAR | Status: AC
  Administered 2022-06-03: 30 mg via INTRAVENOUS

## 2022-06-03 MED ORDER — ORAL CARE MOUTH RINSE: 15.0000 mL | Freq: Once | OROMUCOSAL | Status: AC

## 2022-06-03 MED ORDER — ACETAMINOPHEN 500 MG PO TABS: 1000.0000 mg | ORAL_TABLET | Freq: Once | ORAL | Status: DC | PRN

## 2022-06-03 MED ORDER — POVIDONE-IODINE 10 % EX SWAB
2.0000 | Freq: Once | CUTANEOUS | Status: AC
  Administered 2022-06-03: 2 via TOPICAL

## 2022-06-03 MED ORDER — ROCURONIUM BROMIDE 10 MG/ML (PF) SYRINGE
PREFILLED_SYRINGE | INTRAVENOUS | Status: DC | PRN
  Administered 2022-06-03: 10 mg via INTRAVENOUS
  Administered 2022-06-03: 70 mg via INTRAVENOUS
  Administered 2022-06-03: 50 mg via INTRAVENOUS
  Administered 2022-06-03: 10 mg via INTRAVENOUS
  Administered 2022-06-03: 30 mg via INTRAVENOUS

## 2022-06-03 MED ORDER — DEXAMETHASONE SODIUM PHOSPHATE 10 MG/ML IJ SOLN
INTRAMUSCULAR | Status: DC | PRN
  Administered 2022-06-03: 10 mg via INTRAVENOUS

## 2022-06-03 MED ORDER — DEXAMETHASONE SODIUM PHOSPHATE 10 MG/ML IJ SOLN
INTRAMUSCULAR | Status: AC
  Filled 2022-06-03: qty 1

## 2022-06-03 MED ORDER — ALBUMIN HUMAN 5 % IV SOLN: INTRAVENOUS | Status: DC | PRN

## 2022-06-03 MED ORDER — GABAPENTIN 300 MG PO CAPS
300.0000 mg | ORAL_CAPSULE | ORAL | Status: AC
  Administered 2022-06-03: 300 mg via ORAL
  Filled 2022-06-03: qty 1

## 2022-06-03 MED ORDER — BUPIVACAINE HCL (PF) 0.5 % IJ SOLN
INTRAMUSCULAR | Status: AC
  Filled 2022-06-03: qty 30

## 2022-06-03 MED ORDER — SUGAMMADEX SODIUM 200 MG/2ML IV SOLN
INTRAVENOUS | Status: DC | PRN
  Administered 2022-06-03: 200 mg via INTRAVENOUS

## 2022-06-03 MED ORDER — KETOROLAC TROMETHAMINE 30 MG/ML IJ SOLN
30.0000 mg | Freq: Four times a day (QID) | INTRAMUSCULAR | Status: AC
  Administered 2022-06-03 – 2022-06-04 (×3): 30 mg via INTRAVENOUS
  Filled 2022-06-03 (×4): qty 1

## 2022-06-03 MED ORDER — LIDOCAINE 2% (20 MG/ML) 5 ML SYRINGE
INTRAMUSCULAR | Status: AC
  Filled 2022-06-03: qty 5

## 2022-06-03 MED ORDER — FLUORESCEIN SODIUM 10 % IV SOLN
INTRAVENOUS | Status: AC
  Filled 2022-06-03: qty 5

## 2022-06-03 MED ORDER — PROPOFOL 10 MG/ML IV BOLUS
INTRAVENOUS | Status: DC | PRN
  Administered 2022-06-03: 130 mg via INTRAVENOUS

## 2022-06-03 MED ORDER — MIDAZOLAM HCL 5 MG/5ML IJ SOLN
INTRAMUSCULAR | Status: DC | PRN
  Administered 2022-06-03: 2 mg via INTRAVENOUS

## 2022-06-03 MED ORDER — IBUPROFEN 600 MG PO TABS
600.0000 mg | ORAL_TABLET | Freq: Four times a day (QID) | ORAL | Status: DC
  Administered 2022-06-04 – 2022-06-05 (×3): 600 mg via ORAL
  Filled 2022-06-03 (×3): qty 1

## 2022-06-03 MED ORDER — ONDANSETRON HCL 4 MG/2ML IJ SOLN
4.0000 mg | Freq: Four times a day (QID) | INTRAMUSCULAR | Status: DC | PRN
  Administered 2022-06-05: 4 mg via INTRAVENOUS
  Filled 2022-06-03: qty 2

## 2022-06-03 MED ORDER — SODIUM CHLORIDE 0.9 % IR SOLN
Status: DC | PRN
  Administered 2022-06-03: 1000 mL

## 2022-06-03 MED ORDER — AMLODIPINE BESYLATE 10 MG PO TABS
10.0000 mg | ORAL_TABLET | Freq: Every day | ORAL | Status: DC
  Administered 2022-06-04 – 2022-06-05 (×2): 10 mg via ORAL
  Filled 2022-06-03 (×3): qty 1

## 2022-06-03 MED ORDER — MIDAZOLAM HCL 2 MG/2ML IJ SOLN
INTRAMUSCULAR | Status: AC
  Filled 2022-06-03: qty 2

## 2022-06-03 MED ORDER — OXYCODONE HCL 5 MG PO TABS
5.0000 mg | ORAL_TABLET | ORAL | Status: DC | PRN
  Administered 2022-06-04: 5 mg via ORAL
  Administered 2022-06-04 – 2022-06-05 (×3): 10 mg via ORAL
  Filled 2022-06-03 (×2): qty 2
  Filled 2022-06-03: qty 1
  Filled 2022-06-03: qty 2

## 2022-06-03 MED ORDER — PROPOFOL 10 MG/ML IV BOLUS
INTRAVENOUS | Status: AC
  Filled 2022-06-03: qty 20

## 2022-06-03 SURGICAL SUPPLY — 91 items
ADH SKN CLS APL DERMABOND .7 (GAUZE/BANDAGES/DRESSINGS) ×2
APL PRP STRL LF DISP 70% ISPRP (MISCELLANEOUS) ×2
APL SRG 38 LTWT LNG FL B (MISCELLANEOUS)
APPLICATOR ARISTA FLEXITIP XL (MISCELLANEOUS) IMPLANT
BARRIER ADHS 3X4 INTERCEED (GAUZE/BANDAGES/DRESSINGS) IMPLANT
BLADE 11 SAFETY STRL DISP (BLADE) IMPLANT
BRR ADH 4X3 ABS CNTRL BYND (GAUZE/BANDAGES/DRESSINGS)
CHLORAPREP W/TINT 26 (MISCELLANEOUS) ×2 IMPLANT
COVER BACK TABLE 60X90IN (DRAPES) ×2 IMPLANT
COVER TIP SHEARS 8 DVNC (MISCELLANEOUS) ×2 IMPLANT
DEFOGGER SCOPE WARMER CLEARIFY (MISCELLANEOUS) ×2 IMPLANT
DERMABOND ADVANCED .7 DNX12 (GAUZE/BANDAGES/DRESSINGS) ×2 IMPLANT
DRAPE ARM DVNC X/XI (DISPOSABLE) ×8 IMPLANT
DRAPE CESAREAN BIRTH W POUCH (DRAPES) ×2 IMPLANT
DRAPE COLUMN DVNC XI (DISPOSABLE) ×2 IMPLANT
DRAPE UTILITY XL STRL (DRAPES) ×2 IMPLANT
DRAPE WARM FLUID 44X44 (DRAPES) ×2 IMPLANT
DRIVER NDL MEGA SUTCUT DVNCXI (INSTRUMENTS) ×2 IMPLANT
DRIVER NDLE MEGA SUTCUT DVNCXI (INSTRUMENTS) ×2 IMPLANT
DURAPREP 26ML APPLICATOR (WOUND CARE) ×2 IMPLANT
ELECT REM PT RETURN 9FT ADLT (ELECTROSURGICAL) ×2
ELECTRODE REM PT RTRN 9FT ADLT (ELECTROSURGICAL) ×2 IMPLANT
FORCEPS BPLR R/ABLATION 8 DVNC (INSTRUMENTS) ×2 IMPLANT
FORCEPS PROGRASP DVNC XI (FORCEP) ×2 IMPLANT
GAUZE 4X4 16PLY ~~LOC~~+RFID DBL (SPONGE) ×4 IMPLANT
GLOVE BIO SURGEON STRL SZ 6.5 (GLOVE) ×2 IMPLANT
GLOVE BIO SURGEON STRL SZ7 (GLOVE) ×4 IMPLANT
GLOVE BIOGEL PI IND STRL 7.0 (GLOVE) ×6 IMPLANT
GLOVE SURG SS PI 7.5 STRL IVOR (GLOVE) IMPLANT
GOWN STRL REUS W/ TWL LRG LVL3 (GOWN DISPOSABLE) ×2 IMPLANT
GOWN STRL REUS W/ TWL XL LVL3 (GOWN DISPOSABLE) ×2 IMPLANT
GOWN STRL REUS W/TWL LRG LVL3 (GOWN DISPOSABLE) ×2
GOWN STRL REUS W/TWL XL LVL3 (GOWN DISPOSABLE) ×12 IMPLANT
HEMOSTAT ARISTA ABSORB 3G PWDR (HEMOSTASIS) IMPLANT
HIBICLENS CHG 4% 4OZ BTL (MISCELLANEOUS) ×4 IMPLANT
HOLDER FOLEY CATH W/STRAP (MISCELLANEOUS) ×2 IMPLANT
IRRIG SUCT STRYKERFLOW 2 WTIP (MISCELLANEOUS) ×2
IRRIGATION SUCT STRKRFLW 2 WTP (MISCELLANEOUS) ×2 IMPLANT
KIT PINK PAD W/HEAD ARE REST (MISCELLANEOUS) ×2
KIT PINK PAD W/HEAD ARM REST (MISCELLANEOUS) ×2 IMPLANT
KIT TURNOVER CYSTO (KITS) ×2 IMPLANT
KIT TURNOVER KIT A (KITS) ×2 IMPLANT
LEGGING LITHOTOMY PAIR STRL (DRAPES) ×2 IMPLANT
LIGASURE IMPACT 36 18CM CVD LR (INSTRUMENTS) ×2 IMPLANT
MANIFOLD NEPTUNE II (INSTRUMENTS) ×2 IMPLANT
NEEDLE HYPO 22GX1.5 SAFETY (NEEDLE) ×2 IMPLANT
NS IRRIG 1000ML POUR BTL (IV SOLUTION) ×4 IMPLANT
OBTURATOR OPTICAL STND 8 DVNC (TROCAR) ×2
OBTURATOR OPTICALSTD 8 DVNC (TROCAR) ×2 IMPLANT
OCCLUDER COLPOPNEUMO (BALLOONS) ×2 IMPLANT
PACK ABDOMINAL GYN (CUSTOM PROCEDURE TRAY) ×2 IMPLANT
PACK ROBOT WH (CUSTOM PROCEDURE TRAY) ×2 IMPLANT
PAD ARMBOARD 7.5X6 YLW CONV (MISCELLANEOUS) ×2 IMPLANT
PAD OB MATERNITY 4.3X12.25 (PERSONAL CARE ITEMS) ×2 IMPLANT
PAD POSITIONING PINK XL (MISCELLANEOUS) ×2 IMPLANT
RTRCTR C-SECT PINK 25CM LRG (MISCELLANEOUS) IMPLANT
RTRCTR WOUND ALEXIS 18CM SML (INSTRUMENTS) ×2
SAVER CELL AAL HAEMONETICS (INSTRUMENTS) IMPLANT
SCISSORS MNPLR CVD DVNC XI (INSTRUMENTS) ×2 IMPLANT
SEAL CANN UNIV 5-8 DVNC XI (MISCELLANEOUS) ×8 IMPLANT
SEALER VESSEL EXT DVNC XI (MISCELLANEOUS) IMPLANT
SET CYSTO W/LG BORE CLAMP LF (SET/KITS/TRAYS/PACK) ×2 IMPLANT
SET TRI-LUMEN FLTR TB AIRSEAL (TUBING) ×2 IMPLANT
SPIKE FLUID TRANSFER (MISCELLANEOUS) ×6 IMPLANT
SPONGE T-LAP 18X18 ~~LOC~~+RFID (SPONGE) IMPLANT
SUT VIC AB 0 CT1 18XCR BRD8 (SUTURE) ×2 IMPLANT
SUT VIC AB 0 CT1 27 (SUTURE) ×2
SUT VIC AB 0 CT1 27XBRD ANBCTR (SUTURE) ×4 IMPLANT
SUT VIC AB 0 CT1 8-18 (SUTURE) ×2
SUT VIC AB 3-0 CT1 27 (SUTURE) ×2
SUT VIC AB 3-0 CT1 TAPERPNT 27 (SUTURE) ×2 IMPLANT
SUT VIC AB 3-0 SH 27 (SUTURE) ×2
SUT VIC AB 3-0 SH 27X BRD (SUTURE) IMPLANT
SUT VIC AB 4-0 PS2 27 (SUTURE) ×2 IMPLANT
SUT VICRYL 0 TIES 12 18 (SUTURE) ×2 IMPLANT
SUT VICRYL 0 UR6 27IN ABS (SUTURE) IMPLANT
SUT VICRYL 4-0 PS2 18IN ABS (SUTURE) ×4 IMPLANT
SUT VLOC 180 0 9IN  GS21 (SUTURE) ×4
SUT VLOC 180 0 9IN GS21 (SUTURE) ×2 IMPLANT
SYR CONTROL 10ML LL (SYRINGE) ×2 IMPLANT
SYS BAG RETRIEVAL 10MM (BASKET)
SYSTEM BAG RETRIEVAL 10MM (BASKET) IMPLANT
TIP RUMI ORANGE 6.7MMX12CM (TIP) IMPLANT
TIP UTERINE 5.1X6CM LAV DISP (MISCELLANEOUS) IMPLANT
TIP UTERINE 6.7X10CM GRN DISP (MISCELLANEOUS) IMPLANT
TIP UTERINE 6.7X6CM WHT DISP (MISCELLANEOUS) IMPLANT
TIP UTERINE 6.7X8CM BLUE DISP (MISCELLANEOUS) IMPLANT
TOWEL GREEN STERILE (TOWEL DISPOSABLE) ×2 IMPLANT
TRAY FOLEY W/BAG SLVR 14FR (SET/KITS/TRAYS/PACK) ×2 IMPLANT
TROCAR PORT AIRSEAL 8X120 (TROCAR) ×2 IMPLANT
UNDERPAD 30X36 HEAVY ABSORB (UNDERPADS AND DIAPERS) ×2 IMPLANT

## 2022-06-03 NOTE — Plan of Care (Signed)
  Problem: Education: Goal: Knowledge of General Education information will improve Description: Including pain rating scale, medication(s)/side effects and non-pharmacologic comfort measures Outcome: Progressing   Problem: Health Behavior/Discharge Planning: Goal: Ability to manage health-related needs will improve Outcome: Progressing   Problem: Clinical Measurements: Goal: Ability to maintain clinical measurements within normal limits will improve Outcome: Progressing Goal: Will remain free from infection Outcome: Progressing Goal: Diagnostic test results will improve Outcome: Progressing Goal: Respiratory complications will improve Outcome: Progressing Goal: Cardiovascular complication will be avoided Outcome: Progressing   Problem: Activity: Goal: Risk for activity intolerance will decrease Outcome: Progressing   Problem: Nutrition: Goal: Adequate nutrition will be maintained Outcome: Progressing   Problem: Coping: Goal: Level of anxiety will decrease Outcome: Progressing   Problem: Elimination: Goal: Will not experience complications related to bowel motility Outcome: Progressing Goal: Will not experience complications related to urinary retention Outcome: Progressing   Problem: Pain Managment: Goal: General experience of comfort will improve Outcome: Progressing   Problem: Safety: Goal: Ability to remain free from injury will improve Outcome: Progressing   Problem: Skin Integrity: Goal: Risk for impaired skin integrity will decrease Outcome: Progressing   Problem: Education: Goal: Knowledge of the prescribed therapeutic regimen will improve Outcome: Progressing Goal: Understanding of sexual limitations or changes related to disease process or condition will improve Outcome: Progressing Goal: Individualized Educational Video(s) Outcome: Progressing   Problem: Self-Concept: Goal: Communication of feelings regarding changes in body function or  appearance will improve Outcome: Progressing   Problem: Skin Integrity: Goal: Demonstration of wound healing without infection will improve Outcome: Progressing   

## 2022-06-03 NOTE — Anesthesia Procedure Notes (Signed)
Procedure Name: Intubation Date/Time: 06/03/2022 8:40 AM  Performed by: Quentin Ore, CRNAPre-anesthesia Checklist: Patient identified, Emergency Drugs available, Suction available and Patient being monitored Patient Re-evaluated:Patient Re-evaluated prior to induction Oxygen Delivery Method: Circle system utilized Preoxygenation: Pre-oxygenation with 100% oxygen Induction Type: IV induction Ventilation: Mask ventilation without difficulty Laryngoscope Size: Mac and 3 Grade View: Grade I Tube type: Oral Tube size: 7.0 mm Number of attempts: 1 Airway Equipment and Method: Stylet Placement Confirmation: ETT inserted through vocal cords under direct vision, positive ETCO2 and breath sounds checked- equal and bilateral Secured at: 21 cm Tube secured with: Tape Dental Injury: Teeth and Oropharynx as per pre-operative assessment

## 2022-06-03 NOTE — Op Note (Addendum)
Tracey Gill PROCEDURE DATE: 06/03/2022  PREOPERATIVE DIAGNOSIS: abnormal uterine bleeding  POSTOPERATIVE DIAGNOSIS: abnormal uterine bleeding PROCEDURE:    robotic-assisted total laparoscopic hysterectomy, bilateral salpingectomy, cystoscopy  SURGEON: Lorriane Shire, MD ASSISTANT:  Dr. Donavan Foil.  An experienced assistant was required given the standard of surgical care given the complexity of the case.  This assistant was needed for exposure, dissection, suctioning, retraction, instrument exchange, and for overall help during the procedure.  INDICATIONS: 42 y.o. [id number] with AUB-L.  Risks of surgery were discussed with the patient including but not limited to: bleeding which may require transfusion; infection which may require antibiotics; injury to surrounding organs; need for additional procedures including laparotomy;  and other postoperative/anesthesia complications. Written informed consent was obtained.    FINDINGS:  Normal external genitalia, 20 wk size mobile/immobile uterus with  abrnormal  contours.  Laparoscopically: normal upper abdominal survey, enlarged uterus, normal bilateral fallopian tubes, normal bilateral ovaries, bilateral ureters visualized in the retroperitoneum, normal anterior cul de sac, normal posterior cul de sac, small adhesino of the left ovary to the posterior uterus, right broad ligament fibroid Cystoscopically: normal bladder wall without apparent injury, bilateral ureteral orifices, fluorescein-stained urine from bilateral ureteral orifices   ANESTHESIA: General INTRAVENOUS FLUIDS:  2600 ml of LR ESTIMATED BLOOD LOSS:  500 ml URINE OUTPUT: 250 ml SPECIMENS: uterus, cervix, bilateral fallopian tubes, fibroid COMPLICATIONS:  None immediate.  The risks, benefits, and alternatives of surgery were explained, understood, and accepted. Consents were signed. All questions were answered. She was taken to the operating room and general anesthesia was applied  without complication. She was placed in the dorsal lithotomy position and her abdomen and vagina were prepped and draped after she had been carefully positioned on the table. A bimanual exam revealed a 20 week size uterus that was mobile. Her adnexa were not enlarged. A Foley catheter was placed and it drained clear throughout the case. A speculum was placed and the cervix visualized. A 0 vicryl suture was stitched into the anterior lip of the cervix. The cervix was measured and the uterus was sounded to >12 cm. A Rumi uterine manipulator was placed without difficulty.  Gloves were changed and attention was turned to the abdomen. All incisions were infiltrated with local anesthetic. An 8mm incision was made in the LUQ and an optiview airseal trocar was introduced into the abdomen. The Entry was confirmed with low opening intraabdominal pressure and visualization and the abdomen was then insufflated. After good pneumoperitoneum was established, the abdomen was surveyed including the upper abdomen.She was placed in Trendelenburg position and ports were placed in appropriate positions on her abdomen to allow maximum exposure during the robotic case. Specifically, trocars were placed supraumbilically, in the LLQ, RLQ, and RUQ.  These were all placed under direct laparoscopic visualization after infiltration with local anesthetic. The robot was docked and I proceeded with a robotic portion of the case.  The pelvis was inspected and the uterus was found to be very large with a right broad ligament fibroid.  The fallopian tubes and ovaries were found to be normal.  The right mesosalpinx was serially cauterized and cut using the vessel sealer.  The fallopian tube was passed off for specimen.  The right broad ligament was noted to have a fibroid just lateral to the round ligament.  The overlying peritoneum was sharply incised and the fibroid was grasped and removed from the broad ligament with care to avoid the major  vessels.  The fibroid was tagged with a 0 V-Loc  suture and anchored to the anterior abdominal wall.  The right utero-ovarian ligament was then cauterized and divided.  The round ligament was divided.  The leaves of the broad ligament were then divided, and the anterior broad ligament was serially dissected and divided off the lower uterine segment.  This incision was carried to the contralateral side.  Attention was returned to the right side of the uterus, the posterior leaf of the broad ligament was further divided.  The ureter was identified in the retroperitoneal space.  Attention was then turned to the left side of the uterus, where the mesosalpinx of the fallopian tube was serially cauterized and divided and the fallopian tube passed off for specimen.  The left ovary was sharply separated from its posterior uterine adhesion.  The left utero-ovarian ligament was identified, cauterized, and divided.  The round ligament was then identified and divided.  The leaves of the broad ligament were separated and the ureter was identified in the retroperitoneal space.  Attention was returned anteriorly and the anterior leaf of the broad ligament was fully divided.  The bladder was mobilized off of the lower uterine segment, and the endopelvic fascia was identified.  The uterine vessels were cauterized bilaterally on the lower uterine segment.  At this time, due to limited visualization, decision was made to amputate the corpus of the uterus from the lower uterine segment and cervix.  Once the corpus of the uterus was removed, the bilateral uterine vessels were further skeletonized cauterized and divided.  Pedicles were lateralized off the colpotomy cup.  The colpotomy was completed sharply using monopolar scissors.  The cervix and lower uterine segment were then removed via the vagina.  There was initial attempt at removal of the fibroid and uterus through the vagina, but the vaginal canal was not found to be sufficient  to allow for vaginal morcellation, so decision made for abdominal morcellation.  At this time the specimen moved to the upper abdomen, and the vaginal cuff was then closed using 0 V-Loc in a running fashion. The robot was undocked. The supraumbilical incision was extended inferiorly to the umbilicus for a ~4cm incision, the subcutaneous tissue was divided, and the fascia was identified.  The fascia was sharply divided using scalpel and the peritoneal cavity was entered.  A small Alexis wound retractor was placed within the incision.  The uterus was then manually morcellated through the incision using the ExCITE technique.  Once the entire specimen had been removed the fascia was closed with 0 Vicryl in a running fashion. The subcutaneous tissue was closed with 3-0 vicryl in a running fashion and the skin closed with 4-0 vicryl in a subcuticular running fashion. The camera was used to then inspect the pelvic cavity.  The small fibroid that had been removed from the broad ligament was then noted to be resting in the posterior cul-de-sac.  Decision made to open up the midline incision and then the fibroid was removed.  The fascia of the midline incision was then closed with 0 Vicryl in a running fashion once again.  The pelvis was once again surveyed, and all pedicles were noted to be hemostatic.  At this time attention was then turned to the perineum, and the Foley in pneumo-occluder were removed.  At this point I performed cystoscopy. The cystoscopy revealed fluorescein ejection from both ureters.  20cc of 0.5% Marcaine was injected into the port sites . The remaining skin incisions were closed with 4-0 vicryl and covered with dermabond.  The  patient was then extubated and taken to recovery in stable condition.  Sponge, lap and needle counts were correct x 2.     Lorriane Shire, MD Minimally Invasive Gynecologic Surgery and Chronic Pelvic Pain Specialist Obstetrics and Gynecology, [facility]  for [facility], [facility] 06/03/2022

## 2022-06-03 NOTE — H&P (Signed)
OB/GYN Pre-Op History and Physical  Tracey Gill is a 42 y.o. [id number] presenting for surgical management of AUB.       Past Medical History:  Diagnosis Date   Anemia    Hypertension    Known health problems: none     Past Surgical History:  Procedure Laterality Date   removal of benign right breast mass Left     OB History  Gravida Para Term Preterm AB Living  SAB IAB Ectopic Multiple Live Births               # Outcome Date GA Lbr Len/2nd Weight Sex Delivery Anes PTL Lv  3 Term           2 Term           1 Term             Social History   Socioeconomic History   Marital status: Married    Spouse name: Not on file   Number of children: 3   Years of education: Not on file   Highest education level: Not on file  Occupational History   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  Tobacco Use   Smoking status: Never   Smokeless tobacco: Never  Vaping Use   Vaping Use: Never used  Substance and Sexual Activity   Alcohol use: No   Drug use: No   Sexual activity: Yes  Other Topics Concern   Not on file  Social History Narrative   Not on file   Social Determinants of Health   Financial Resource Strain: Not on file  Food Insecurity: Not on file  Transportation Needs: Not on file  Physical Activity: Not on file  Stress: Not on file  Social Connections: Not on file    Family History  Problem Relation Age of Onset   Diabetes Mother    Diabetes Father    Heart disease Father    Diabetes Sister     Medications Prior to Admission  Medication Sig Dispense Refill Last Dose   amLODipine (NORVASC) 10 MG tablet Take 1 tablet (10 mg total) by mouth daily. To lower blood pressure 90 tablet 1 06/03/2022 at 0500   Blood Pressure Monitoring (BLOOD PRESSURE KIT) DEVI Use to measure blood pressure 1 each 0    ferrous sulfate 325 (65 FE) MG tablet Take 1 tablet (325 mg total) by mouth 3 (three) times daily with meals. 90 tablet 1 05/29/2022   megestrol (MEGACE)  40 MG tablet Take 1 tablet (40 mg total) by mouth 2 (two) times daily. Can increase to two tablets twice a day in the event of heavy bleeding 60 tablet 5 05/29/2022   Multiple Vitamin (MULTIVITAMIN) tablet Take 1 tablet by mouth daily.   05/29/2022   valsartan (DIOVAN) 40 MG tablet Take 1 tablet (40 mg total) by mouth daily. 90 tablet 3 05/29/2022   leuprolide (LUPRON DEPOT, 79-MONTH,) 11.25 MG injection Inject 11.25 mg into the muscle every 3 (three) months. (Patient not taking: Reported on 05/25/2022) 1 each 0 Not Taking    No Known Allergies  Review of Systems: Negative except for what is mentioned in HPI.     Physical Exam: BP 135/78   Pulse 71   Temp 98.6 F (37 C) (Oral)   Resp 20   Ht 5' (1.524 m)   Wt 67.1 kg   LMP 04/13/2022 (Within Weeks)   SpO2 100%   BMI 28.90 kg/m  CONSTITUTIONAL: Well-developed, well-nourished female in no acute distress.  HENT:  Normocephalic, atraumatic, External right and left ear normal. Oropharynx is clear and moist EYES: Conjunctivae and EOM are normal. Pupils are equal, round, and reactive to light. No scleral icterus.  SKIN: Skin is warm and dry. No rash noted. Not diaphoretic. No erythema. No pallor. NEUROLGIC: Alert and oriented to person, place, and time. Normal reflexes, muscle tone coordination. No cranial nerve deficit noted. PSYCHIATRIC: Normal mood and affect. Normal behavior. Normal judgment and thought content. RESPIRATORY: Normal effort ABDOMEN: Soft, noted tenderness in right mid abdomen a few cm lateral to umbilicus PELVIC: Deferred   Pertinent Labs/Studies:   Results for orders placed or performed during the hospital encounter of 06/03/22 (from the past 72 hour(s))  Pregnancy, urine POC     Status: None   Collection Time: 06/03/22  7:12 AM  Result Value Ref Range   Preg Test, Ur NEGATIVE NEGATIVE    Comment:        THE SENSITIVITY OF THIS METHODOLOGY IS >24 mIU/mL        Assessment and Plan :Tracey Gill is a 42  y.o. [id number] here for AUB.   Patient desires surgical management with RA-TLH, BS, cysto possible open.  The risks of surgery were discussed in detail with the patient including but not limited to: bleeding which may require transfusion or reoperation; infection which may require prolonged hospitalization or re-hospitalization and antibiotic therapy; injury to bowel, bladder, ureters and major vessels or other surrounding organs which may lead to other procedures; formation of adhesions; need for additional procedures including laparotomy or subsequent procedures secondary to intraoperative injury or abnormal pathology; thromboembolic phenomenon; incisional problems and other postoperative or anesthesia complications.  Patient was told that the likelihood that her condition and symptoms will be treated effectively with this surgical management was very high; the postoperative expectations were also discussed in detail. The patient also understands the alternative treatment options which were discussed in full. All questions were answered.  She was told that she will be contacted by our surgical scheduler regarding the time and date of her surgery; routine preoperative instructions will be given to her by the preoperative nursing team.  Printed patient education handouts about the procedure were given to the patient to review at home.    Lorriane Shire, M.D. Minimally Invasive Gynecologic Surgery and Pelvic Pain Specialist Attending Obstetrician & Gynecologist, [facility] [employer], [facility]

## 2022-06-03 NOTE — Progress Notes (Signed)
Pt refused admission questions for now. Pt wants to rest.

## 2022-06-03 NOTE — Anesthesia Preprocedure Evaluation (Signed)
Anesthesia Evaluation  Patient identified by MRN, date of birth, ID band Patient awake    Reviewed: Allergy & Precautions, NPO status , Patient's Chart, lab work & pertinent test results  History of Anesthesia Complications Negative for: history of anesthetic complications  Airway Mallampati: I  TM Distance: >3 FB Neck ROM: Full    Dental  (+) Dental Advisory Given, Teeth Intact, Caps,    Pulmonary neg pulmonary ROS   breath sounds clear to auscultation       Cardiovascular hypertension, Pt. on medications (-) angina (-) Past MI and (-) CHF  Rhythm:Regular     Neuro/Psych negative neurological ROS  negative psych ROS   GI/Hepatic negative GI ROS, Neg liver ROS,,,  Endo/Other  negative endocrine ROS    Renal/GU negative Renal ROS     Musculoskeletal   Abdominal   Peds  Hematology  (+) Blood dyscrasia, anemia Lab Results      Component                Value               Date                      WBC                      6.0                 05/28/2022                HGB                      11.3 (L)            05/28/2022                HCT                      34.7 (L)            05/28/2022                MCV                      80.7                05/28/2022                PLT                      303                 05/28/2022              Anesthesia Other Findings   Reproductive/Obstetrics Lab Results      Component                Value               Date                      PREGTESTUR               NEGATIVE            06/03/2022                HCG                      <  5.0                12/22/2020                                        Anesthesia Physical Anesthesia Plan  ASA: 2  Anesthesia Plan: General   Post-op Pain Management: Tylenol PO (pre-op)*, Gabapentin PO (pre-op)* and Toradol IV (intra-op)*   Induction:   PONV Risk Score and Plan: 3 and Ondansetron,  Dexamethasone and Propofol infusion  Airway Management Planned: Oral ETT  Additional Equipment: None  Intra-op Plan:   Post-operative Plan: Extubation in OR  Informed Consent: I have reviewed the patients History and Physical, chart, labs and discussed the procedure including the risks, benefits and alternatives for the proposed anesthesia with the patient or authorized representative who has indicated his/her understanding and acceptance.     Dental advisory given  Plan Discussed with: CRNA  Anesthesia Plan Comments:        Anesthesia Quick Evaluation

## 2022-06-03 NOTE — Transfer of Care (Signed)
Immediate Anesthesia Transfer of Care Note  Patient: Tracey Gill  Procedure(s) Performed: XI ROBOTIC ASSISTED TOTAL HYSTERECTOMY WITH SALPINGECTOMY   WITH MINI LAPAROTOMY (Bilateral) CYSTOSCOPY HYSTERECTOMY ABDOMINAL WITH SALPINGECTOMY (Bilateral)  Patient Location: PACU  Anesthesia Type:General  Level of Consciousness: awake, alert , and oriented  Airway & Oxygen Therapy: Patient Spontanous Breathing  Post-op Assessment: Report given to RN, Post -op Vital signs reviewed and stable, and Patient moving all extremities X 4  Post vital signs: Reviewed and stable  Last Vitals:  Vitals Value Taken Time  BP 178/133 06/03/22 1343  Temp    Pulse 114 06/03/22 1345  Resp 19 06/03/22 1345  SpO2 100 % 06/03/22 1345  Vitals shown include unvalidated device data.  Last Pain:  Vitals:   06/03/22 0700  TempSrc:   PainSc: 0-No pain         Complications: No notable events documented.

## 2022-06-03 NOTE — Anesthesia Postprocedure Evaluation (Signed)
Anesthesia Post Note  Patient: Tracey Gill  Procedure(s) Performed: XI ROBOTIC ASSISTED TOTAL HYSTERECTOMY WITH SALPINGECTOMY   WITH MINI LAPAROTOMY (Bilateral) CYSTOSCOPY HYSTERECTOMY ABDOMINAL WITH SALPINGECTOMY (Bilateral)     Patient location during evaluation: PACU Anesthesia Type: General Level of consciousness: awake and alert Pain management: pain level controlled Vital Signs Assessment: post-procedure vital signs reviewed and stable Respiratory status: spontaneous breathing, nonlabored ventilation, respiratory function stable and patient connected to nasal cannula oxygen Cardiovascular status: blood pressure returned to baseline and stable Postop Assessment: no apparent nausea or vomiting Anesthetic complications: no   No notable events documented.  Last Vitals:  Vitals:   06/03/22 1500 06/03/22 1511  BP: 108/73 132/79  Pulse: 93 98  Resp: 13 16  Temp: 36.8 C 36.9 C  SpO2: 98% 97%    Last Pain:  Vitals:   06/03/22 1527  TempSrc:   PainSc: 8                  Evalee Gerard

## 2022-06-03 NOTE — Brief Op Note (Signed)
06/03/2022  1:10 PM  PATIENT:  Tracey Gill  42 y.o. female  PRE-OPERATIVE DIAGNOSIS:  Menorrhagia Fibroids  POST-OPERATIVE DIAGNOSIS:  MenorrhagiaFibroids  PROCEDURE:  Procedure(s) with comments: XI ROBOTIC ASSISTED TOTAL HYSTERECTOMY WITH SALPINGECTOMY   WITH MINI LAPAROTOMY (Bilateral) - TAP BLOCK Wants the 30 degree robotic scope and the vessel sealer CYSTOSCOPY (N/A) HYSTERECTOMY ABDOMINAL WITH SALPINGECTOMY (Bilateral) - MINI LAPAROTOMY FOR UTERUS REMOVAL  SURGEON:  Surgeon(s) and Role:    Lorriane Shire, MD - Primary    * Warden Fillers, MD - Assisting  ASSISTANTSDonavan Foil, MD   ANESTHESIA:   local and general  EBL:  500 mL   BLOOD ADMINISTERED:none  DRAINS: none   LOCAL MEDICATIONS USED:  BUPIVICAINE   SPECIMEN:  Source of Specimen:  UTERUS, CERVIX, BILATERAL FALLOPIAN TUBES, FIBROID  DISPOSITION OF SPECIMEN:  PATHOLOGY  COUNTS:  YES  TOURNIQUET:  * No tourniquets in log *  DICTATION: .Note written in EPIC  PLAN OF CARE: Admit for overnight observation  PATIENT DISPOSITION:  PACU - hemodynamically stable.   Delay start of Pharmacological VTE agent (>24hrs) due to surgical blood loss or risk of bleeding: no

## 2022-06-04 ENCOUNTER — Encounter (HOSPITAL_COMMUNITY): Payer: Self-pay | Admitting: Obstetrics and Gynecology

## 2022-06-04 LAB — CBC
HCT: 23.8 % — ABNORMAL LOW (ref 36.0–46.0)
Hemoglobin: 7.4 g/dL — ABNORMAL LOW (ref 12.0–15.0)
MCH: 26.1 pg (ref 26.0–34.0)
MCHC: 31.1 g/dL (ref 30.0–36.0)
MCV: 83.8 fL (ref 80.0–100.0)
Platelets: 217 10*3/uL (ref 150–400)
RBC: 2.84 MIL/uL — ABNORMAL LOW (ref 3.87–5.11)
RDW: 23.6 % — ABNORMAL HIGH (ref 11.5–15.5)
WBC: 9.6 10*3/uL (ref 4.0–10.5)
nRBC: 0 % (ref 0.0–0.2)

## 2022-06-04 LAB — BASIC METABOLIC PANEL
Anion gap: 7 (ref 5–15)
BUN: 8 mg/dL (ref 6–20)
CO2: 22 mmol/L (ref 22–32)
Calcium: 8 mg/dL — ABNORMAL LOW (ref 8.9–10.3)
Chloride: 107 mmol/L (ref 98–111)
Creatinine, Ser: 0.48 mg/dL (ref 0.44–1.00)
GFR, Estimated: >60 mL/min (ref >60)
Glucose, Bld: 118 mg/dL — ABNORMAL HIGH (ref 70–99)
Potassium: 3.8 mmol/L (ref 3.5–5.1)
Sodium: 136 mmol/L (ref 135–145)

## 2022-06-04 MED ORDER — PHENOL 1.4 % MT LIQD
1.0000 | OROMUCOSAL | Status: DC | PRN
  Administered 2022-06-04: 1 via OROMUCOSAL
  Filled 2022-06-04: qty 177

## 2022-06-04 MED ORDER — CLONIDINE HCL 0.1 MG PO TABS: 0.1000 mg | ORAL_TABLET | Freq: Two times a day (BID) | ORAL | Status: DC | PRN

## 2022-06-04 MED ORDER — LACTATED RINGERS IV BOLUS
1000.0000 mL | Freq: Once | INTRAVENOUS | Status: AC
  Administered 2022-06-04: 1000 mL via INTRAVENOUS

## 2022-06-04 MED ORDER — LACTATED RINGERS IV SOLN: INTRAVENOUS | Status: DC

## 2022-06-04 MED ORDER — PANTOPRAZOLE SODIUM 40 MG IV SOLR
40.0000 mg | Freq: Every day | INTRAVENOUS | Status: DC
  Administered 2022-06-04 – 2022-06-05 (×2): 40 mg via INTRAVENOUS
  Filled 2022-06-04 (×2): qty 10

## 2022-06-04 NOTE — Progress Notes (Signed)
Gynecology Progress Note  Admission Date: 06/03/2022 Current Date: 06/04/2022 8:11 AM  Tracey Gill is a 42 y.o. [id number] HD#2/POD#1 admitted for postp observation   History complicated by: Patient Active Problem List   Diagnosis Date Noted   Abnormal uterine bleeding 06/03/2022   Intramural, submucous, and subserous leiomyoma of uterus 06/03/2022   Chronic pain of both knees 01/13/2022   Iron deficiency anemia due to chronic blood loss 08/04/2021   Menorrhagia 06/16/2021   Intramural and subserous leiomyoma of uterus 02/03/2021   Elevated LDL cholesterol level 10/01/2020   Essential hypertension 09/16/2020   BMI 27.0-27.9,adult 09/16/2020    ROS and patient/family/surgical history, located on admission H&P note dated 06/03/2022, have been reviewed, and there are no changes except as noted below Yesterday/Overnight Events:  Admitted for observation following scheduled RA-TLH, BS, cysto  Subjective:  Phone interpreter used for encounter Patient reports pain is well controlled but also notes upper abdominal discomfort when sipping water Voiding - but not getting up to void, given pure wick Feels dizziness, often after getting pain meds Denies nausea and vomiting No flatus  Notes episode of feeling like she could breath well once overnight Has been very tired  Objective:   Vitals:   06/03/22 2150 06/03/22 2150 06/04/22 0018 06/04/22 0455  BP: 134/76 (!) 151/88 (!) 145/85 (!) 141/81  Pulse: 100 (!) 118 (!) 102 89  Resp: Temp: 98.2 F (36.8 C)  98.7 F (37.1 C) 98.4 F (36.9 C)  TempSrc:   Oral Oral  SpO2:  100% 100% 100%  Weight:      Height:        Temp:  [98.1 F (36.7 C)-98.7 F (37.1 C)] 98.4 F (36.9 C) (04/18 0455) Pulse Rate:  [87-118] 89 (04/18 0455) Resp:  [13-20] 19 (04/18 0455) BP: (108-151)/(69-88) 141/81 (04/18 0455) SpO2:  [97 %-100 %] 100 % (04/18 0455) I/O last 3 completed shifts: In: 3250 [P.O.:50; I.V.:2600; IV  Piggyback:600] Out: 1000 [Urine:500; Blood:500] No intake/output data recorded.  Intake/Output Summary (Last 24 hours) at 06/04/2022 0811 Last data filed at 06/04/2022 0300 Gross per 24 hour  Intake 3250 ml  Output 1000 ml  Net 2250 ml     Current Vital Signs 24h Vital Sign Ranges  T 98.4 F (36.9 C) Temp  Avg: 98.3 F (36.8 C)  Min: 98.1 F (36.7 C)  Max: 98.7 F (37.1 C)  BP (!) 141/81 BP  Min: 108/73  Max: 151/88  HR 89 Pulse  Avg: 99.3  Min: 87  Max: 118  RR 19 Resp  Avg: 17.6  Min: 13  Max: 20  SaO2 100 % Nasal Cannula SpO2  Avg: 99.2 %  Min: 97 %  Max: 100 %       24 Hour I/O Current Shift I/O  Time Ins Outs 04/17 0701 - 04/18 0700 In: 3250 [P.O.:50; I.V.:2600] Out: 1000 [Urine:500] No intake/output data recorded.   Patient Vitals for the past 12 hrs:  BP Temp Temp src Pulse Resp SpO2  06/04/22 0455 (!) 141/81 98.4 F (36.9 C) Oral 89 19 100 %  06/04/22 0018 (!) 145/85 98.7 F (37.1 C) Oral (!) 102 18 100 %  06/03/22 2150 (!) 151/88 -- -- (!) 118 -- 100 %  06/03/22 2150 134/76 98.2 F (36.8 C) -- 100 18 --     Patient Vitals for the past 24 hrs:  BP Temp Temp src Pulse Resp SpO2  06/04/22 0455 (!) 141/81 98.4 F (36.9 C) Oral  89 19 100 %  06/04/22 0018 (!) 145/85 98.7 F (37.1 C) Oral (!) 102 18 100 %  06/03/22 2150 (!) 151/88 -- -- (!) 118 -- 100 %  06/03/22 2150 134/76 98.2 F (36.8 C) -- 100 18 --  06/03/22 2000 132/78 98.1 F (36.7 C) Oral 87 17 --  06/03/22 1511 132/79 98.4 F (36.9 C) Oral 98 16 97 %  06/03/22 1500 108/73 98.2 F (36.8 C) -- 93 13 98 %  06/03/22 1445 112/69 -- -- 96 17 98 %  06/03/22 1430 119/81 -- -- 97 19 100 %  06/03/22 1415 118/72 -- -- 95 20 100 %  06/03/22 1400 124/82 -- -- (!) 107 18 100 %  06/03/22 1345 134/80 98.1 F (36.7 C) -- (!) 110 19 99 %    Physical exam: General appearance: cooperative and no distress Abdomen:  minimally tender GU: No gross VB Lungs:  normal respiratory effort, Bradley on  Skin: incisions  clean/dry/intact with skin glue Psych: appropriate Neurologic: Grossly normal  Medications Current Facility-Administered Medications  Medication Dose Route Frequency Provider Last Rate Last Admin   acetaminophen (TYLENOL) tablet 1,000 mg  1,000 mg Oral Q6H Ravan Schlemmer, MD   1,000 mg at 06/03/22 1716   amLODipine (NORVASC) tablet 10 mg  10 mg Oral Daily Lodie Waheed, MD       docusate sodium (COLACE) capsule 100 mg  100 mg Oral BID Mekayla Soman, MD       ketorolac (TORADOL) 30 MG/ML injection 30 mg  30 mg Intravenous Q6H Rodman Recupero, MD   30 mg at 06/04/22 4098   Followed by   ibuprofen (ADVIL) tablet 600 mg  600 mg Oral Q6H Carliss Quast, MD       ondansetron (ZOFRAN) tablet 4 mg  4 mg Oral Q6H PRN Bayard More, MD       Or   ondansetron (ZOFRAN) injection 4 mg  4 mg Intravenous Q6H PRN Torrance Frech, MD       oxyCODONE (Oxy IR/ROXICODONE) immediate release tablet 5-10 mg  5-10 mg Oral Q4H PRN Jamelle Noy, MD       polyethylene glycol (MIRALAX / GLYCOLAX) packet 17 g  17 g Oral Daily PRN Lorriane Shire, MD       senna (SENOKOT) tablet 8.6 mg  1 tablet Oral BID Kaisley Stiverson, MD       simethicone (MYLICON) chewable tablet 80 mg  80 mg Oral QID PRN Lorriane Shire, MD          Labs  Recent Labs  Lab 05/28/22 1528 06/04/22 0051  WBC 6.0 9.6  HGB 11.3* 7.4*  HCT 34.7* 23.8*  PLT 303 217    Recent Labs  Lab 05/28/22 1528 06/04/22 0051  NA 140 136  K 3.1* 3.8  CL 107 107  CO2 25 22  BUN 10 8  CREATININE 0.50 0.48  CALCIUM 8.8* 8.0*  GLUCOSE 109* 118*     Assessment & Plan:  Pain well controlled but declining po meds *GYN: s/p RA-TLH, BS, cysto * CBC appropriate for surgical loss, will need iron postoperatively  *Pain: stop IV dialudid and encouraged to take po meds since this will be home regimen *FEN/GI: tolerating po liquids, will see if food tolerated *PPx: SCDs, ambulation encouraged *Dispo: not yet  meeting postop goals - either later today or tomorrow if not met  Code Status: Full Code    Lorriane Shire, MD Minimally Invasive Gynecologic Surgery Center for [facility] (Faculty Practice) 06/04/22 8:11  AM

## 2022-06-04 NOTE — Progress Notes (Signed)
Phone interpreter used  S: has started passing flatus within the last hour. Tolerating water and jello. Attempted to stand up but felt short of breath and very tired. Feels like she is full of gas and also can't fully catch her breath. Denies nausea.   O: Vitals:   06/04/22 0455 06/04/22 0823 06/04/22 1150 06/04/22 1701  BP: (!) 141/81 132/82 126/76 117/75  Pulse: 89 92 93 97  Resp: Temp: 98.4 F (36.9 C) 99.1 F (37.3 C) 99.3 F (37.4 C) 98 F (36.7 C)  TempSrc: Oral Oral Oral Oral  SpO2: 100% 100% 100% 100%  Weight:      Height:        Lying in bed, slightly tachypnic   A/P: stable vital signs and satting well on room air Encouraged ambulation SCDs when in bed Encouraged hot liquids and chewing gum to help with moving gas CBC in AM unless needed sooner PO analgesia Monitor I/O Not yet meeting postop goals for discharge  Lorriane Shire, MD, FACOG Minimally Invasive Gynecologic Surgery  Obstetrics and Gynecology, [facility] for [facility], [facility] 06/04/22

## 2022-06-04 NOTE — TOC CM/SW Note (Addendum)
  Transition of Care [facility]) Screening Note   Patient Details  Name: Tracey Gill Date of Birth: 10/16/1980   Active with [facility] and uses their pharmacy    Post op day 1, working on pain control.  Transition of Care Department [facility]) has reviewed patient and no TOC needs have been identified at this time. We will continue to monitor patient advancement through interdisciplinary progression rounds. If new patient transition needs arise, please place a TOC consult.

## 2022-06-05 ENCOUNTER — Other Ambulatory Visit: Payer: Self-pay | Admitting: Obstetrics and Gynecology

## 2022-06-05 ENCOUNTER — Other Ambulatory Visit (HOSPITAL_COMMUNITY): Payer: Self-pay

## 2022-06-05 ENCOUNTER — Encounter: Payer: Self-pay | Admitting: Critical Care Medicine

## 2022-06-05 ENCOUNTER — Encounter: Payer: Self-pay | Admitting: Hematology and Oncology

## 2022-06-05 DIAGNOSIS — D62 Acute posthemorrhagic anemia: Secondary | ICD-10-CM | POA: Insufficient documentation

## 2022-06-05 LAB — CBC
HCT: 20.8 % — ABNORMAL LOW (ref 36.0–46.0)
Hemoglobin: 6.8 g/dL — CL (ref 12.0–15.0)
MCH: 27.3 pg (ref 26.0–34.0)
MCHC: 32.7 g/dL (ref 30.0–36.0)
MCV: 83.5 fL (ref 80.0–100.0)
Platelets: 191 10*3/uL (ref 150–400)
RBC: 2.49 MIL/uL — ABNORMAL LOW (ref 3.87–5.11)
RDW: 23.9 % — ABNORMAL HIGH (ref 11.5–15.5)
WBC: 7.7 10*3/uL (ref 4.0–10.5)
nRBC: 0 % (ref 0.0–0.2)

## 2022-06-05 LAB — SURGICAL PATHOLOGY

## 2022-06-05 MED ORDER — SIMETHICONE 80 MG PO CHEW
80.0000 mg | CHEWABLE_TABLET | Freq: Four times a day (QID) | ORAL | 0 refills | Status: DC | PRN
  Filled 2022-06-05: qty 30, 8d supply, fill #0

## 2022-06-05 MED ORDER — ONDANSETRON HCL 4 MG PO TABS
4.0000 mg | ORAL_TABLET | Freq: Four times a day (QID) | ORAL | 0 refills | Status: DC | PRN
  Filled 2022-06-05: qty 20, 5d supply, fill #0

## 2022-06-05 MED ORDER — PHENOL 1.4 % MT LIQD
1.0000 | OROMUCOSAL | 0 refills | Status: DC | PRN
  Filled 2022-06-05: qty 177, fill #0

## 2022-06-05 MED ORDER — SENNA 8.6 MG PO TABS
1.0000 | ORAL_TABLET | Freq: Two times a day (BID) | ORAL | 0 refills | Status: DC
  Filled 2022-06-05: qty 120, 60d supply, fill #0

## 2022-06-05 MED ORDER — POLYETHYLENE GLYCOL 3350 17 GM/SCOOP PO POWD
17.0000 g | Freq: Every day | ORAL | 0 refills | Status: DC | PRN
  Filled 2022-06-05: qty 238, 14d supply, fill #0

## 2022-06-05 MED ORDER — IBUPROFEN 600 MG PO TABS
600.0000 mg | ORAL_TABLET | Freq: Four times a day (QID) | ORAL | 2 refills | Status: DC
  Filled 2022-06-05: qty 60, 15d supply, fill #0

## 2022-06-05 MED ORDER — OXYCODONE HCL 5 MG PO TABS
5.0000 mg | ORAL_TABLET | ORAL | 0 refills | Status: DC | PRN
  Filled 2022-06-05: qty 20, 4d supply, fill #0

## 2022-06-05 MED ORDER — ACETAMINOPHEN 500 MG PO TABS
1000.0000 mg | ORAL_TABLET | Freq: Four times a day (QID) | ORAL | 2 refills | Status: DC
  Filled 2022-06-05: qty 120, 15d supply, fill #0

## 2022-06-05 NOTE — Discharge Summary (Signed)
Physician Discharge Summary  Patient ID: Tracey Gill MRN: [id number] DOB/AGE: 04-21-1980 42 y.o.  Admit date: 06/03/2022 Discharge date: 06/05/2022  Admission Diagnoses: abnormal uterine bleeding  Discharge Diagnoses:  Principal Problem:   Abnormal uterine bleeding Active Problems:   Intramural, submucous, and subserous leiomyoma of uterus   Discharged Condition: stable  Hospital Course: patient admitted following scheduled RA-TLH, BS, cysto. Admitted and was able to void on POD#0. On POD#1 was not yet ambulating and very drowsy, IV narcotics were stopped an encouraged po analgesia. Hg 7.4 which was expected drop from surgical losses. Given IV fluid bolus and encouraged to ambulate and use simethicone for gas pain. Significant clinical improvement on POD#2 despite hemoglobin slightly under 7. Recommend IV iron transfusion outpatient, likely final equilibration of blood count following procedure. Hysterectomy resolves original source of chronic blood loss.   Consults: None  Significant Diagnostic Studies: labs: H/H 6.8/20.8  Treatments: IV hydration and analgesia: acetaminophen and ketorolac and oxycodone  Discharge Exam: Blood pressure 112/77, pulse 84, temperature 98.5 F (36.9 C), temperature source Oral, resp. rate 20, height 5' (1.524 m), weight 67.1 kg, last menstrual period 04/13/2022, SpO2 100 %. General appearance: alert, cooperative, and no distress Resp: clear to auscultation bilaterally GI: soft, minimally tender Incision/Wound:  Disposition: discharge home today  IV iron outpatient with follow up CBC in 4-6 weeks    Allergies as of 06/05/2022   No Known Allergies      Medication List     STOP taking these medications    Lupron Depot (54-Month) 11.25 MG injection Generic drug: leuprolide   megestrol 40 MG tablet Commonly known as: MEGACE       TAKE these medications    acetaminophen 500 MG tablet Commonly known as: TYLENOL Take 2 tablets  (1,000 mg total) by mouth every 6 (six) hours.   amLODipine 10 MG tablet Commonly known as: NORVASC Take 1 tablet (10 mg total) by mouth daily. To lower blood pressure   Blood Pressure Kit Devi Use to measure blood pressure   FeroSul 325 (65 FE) MG tablet Generic drug: ferrous sulfate Take 1 tablet (325 mg total) by mouth 3 (three) times daily with meals.   ibuprofen 600 MG tablet Commonly known as: ADVIL Take 1 tablet (600 mg total) by mouth every 6 (six) hours.   multivitamin tablet Take 1 tablet by mouth daily.   ondansetron 4 MG tablet Commonly known as: ZOFRAN Take 1 tablet (4 mg total) by mouth every 6 (six) hours as needed for nausea.   oxyCODONE 5 MG immediate release tablet Commonly known as: Oxy IR/ROXICODONE Take 1 tablet (5 mg total) by mouth every 4 (four) hours as needed for moderate pain.   phenol 1.4 % Liqd Commonly known as: CHLORASEPTIC Use as directed 1 spray in the mouth or throat as needed for throat irritation / pain.   polyethylene glycol 17 g packet Commonly known as: MIRALAX / GLYCOLAX Take 17 g by mouth daily as needed for mild constipation.   senna 8.6 MG Tabs tablet Commonly known as: SENOKOT Take 1 tablet (8.6 mg total) by mouth 2 (two) times daily.   simethicone 80 MG chewable tablet Commonly known as: MYLICON Chew 1 tablet (80 mg total) by mouth 4 (four) times daily as needed for flatulence.   valsartan 40 MG tablet Commonly known as: DIOVAN Tome 1 tableta (40 mg en total) por va oral diariamente. (Take 1 tablet (40 mg total) by mouth daily.)  Signed: Lorriane Shire 06/05/2022, 7:41 AM

## 2022-06-05 NOTE — Progress Notes (Signed)
Gynecology Progress Note  Admission Date: 06/03/2022 Current Date: 06/05/2022 7:36 AM  Phone spanish interpreter used for this encounter  Tracey Gill is a 42 y.o. [id number] HD#3/POD#2 admitted for postop pain control    History complicated by: Patient Active Problem List   Diagnosis Date Noted   Abnormal uterine bleeding 06/03/2022   Intramural, submucous, and subserous leiomyoma of uterus 06/03/2022   Chronic pain of both knees 01/13/2022   Iron deficiency anemia due to chronic blood loss 08/04/2021   Menorrhagia 06/16/2021   Intramural and subserous leiomyoma of uterus 02/03/2021   Elevated LDL cholesterol level 10/01/2020   Essential hypertension 09/16/2020   BMI 27.0-27.9,adult 09/16/2020    ROS and patient/family/surgical history, located on admission H&P note dated 06/03/2022, have been reviewed, and there are no changes except as noted below Yesterday/Overnight Events:  Feeling very tired w/ mild SOB  Subjective:  Doing much better this AM. Feels ready to go home. Dizziness has mostly resolved. Has not been feeling very hungry but is ready to eat this morning. Feels some phlegm in her throat. Wondering if she can shower. No vaginal bleeding. Has been able to get up. Passing flatus. Denies nausea/vomiting.   Objective:   Vitals:   06/04/22 1150 06/04/22 1701 06/04/22 2027 06/05/22 0630  BP: 126/76 117/75 133/79 112/77  Pulse: 93 97 98 84  Resp: Temp: 99.3 F (37.4 C) 98 F (36.7 C) 98.2 F (36.8 C) 98.5 F (36.9 C)  TempSrc: Oral Oral Oral Oral  SpO2: 100% 100% 99% 100%  Weight:      Height:        Temp:  [98 F (36.7 C)-99.3 F (37.4 C)] 98.5 F (36.9 C) (04/19 0630) Pulse Rate:  [84-98] 84 (04/19 0630) Resp:  [16-20] 20 (04/19 0630) BP: (112-133)/(75-82) 112/77 (04/19 0630) SpO2:  [99 %-100 %] 100 % (04/19 0630) I/O last 3 completed shifts: In: 2078.8 [P.O.:290; I.V.:1788.8] Out: 950 [Urine:950] No intake/output data  recorded.  Intake/Output Summary (Last 24 hours) at 06/05/2022 0736 Last data filed at 06/05/2022 0600 Gross per 24 hour  Intake 2028.81 ml  Output 950 ml  Net 1078.81 ml     Current Vital Signs 24h Vital Sign Ranges  T 98.5 F (36.9 C) Temp  Avg: 98.6 F (37 C)  Min: 98 F (36.7 C)  Max: 99.3 F (37.4 C)  BP 112/77 BP  Min: 112/77  Max: 133/79  HR 84 Pulse  Avg: 92.8  Min: 84  Max: 98  RR 20 Resp  Avg: 18  Min: 16  Max: 20  SaO2 100 % Room Air SpO2  Avg: 99.8 %  Min: 99 %  Max: 100 %       24 Hour I/O Current Shift I/O  Time Ins Outs 04/18 0701 - 04/19 0700 In: 2028.8 [P.O.:240; I.V.:1788.8] Out: 950 [Urine:950] No intake/output data recorded.   Patient Vitals for the past 12 hrs:  BP Temp Temp src Pulse Resp SpO2  06/05/22 0630 112/77 98.5 F (36.9 C) Oral 84 20 100 %  06/04/22 2027 133/79 98.2 F (36.8 C) Oral 98 20 99 %     Patient Vitals for the past 24 hrs:  BP Temp Temp src Pulse Resp SpO2  06/05/22 0630 112/77 98.5 F (36.9 C) Oral 84 20 100 %  06/04/22 2027 133/79 98.2 F (36.8 C) Oral 98 20 99 %  06/04/22 1701 117/75 98 F (36.7 C) Oral 97 16 100 %  06/04/22 1150 126/76  99.3 F (37.4 C) Oral 93 17 100 %  06/04/22 0823 132/82 99.1 F (37.3 C) Oral 92 17 100 %    Physical exam: General appearance: alert, cooperative, and no distress Abdomen:  soft, minimally tender GU: No gross VB Lungs: clear to auscultation bilaterally Heart: S1, S2 normal, no murmur, rub or gallop, regular rate and rhythm Extremities: no lower extremity edema Skin: intact, incision clean/dry without erythema Psych: appropriate Neurologic: Grossly normal  Medications Current Facility-Administered Medications  Medication Dose Route Frequency Provider Last Rate Last Admin   acetaminophen (TYLENOL) tablet 1,000 mg  1,000 mg Oral Q6H Ylianna Almanzar, MD   1,000 mg at 06/05/22 1610   amLODipine (NORVASC) tablet 10 mg  10 mg Oral Daily Letti Towell, MD   10 mg at 06/04/22  0845   cloNIDine (CATAPRES) tablet 0.1 mg  0.1 mg Oral BID PRN Lorriane Shire, MD       docusate sodium (COLACE) capsule 100 mg  100 mg Oral BID Scout Guyett, MD   100 mg at 06/04/22 2019   ibuprofen (ADVIL) tablet 600 mg  600 mg Oral Q6H Kaspian Muccio, MD   600 mg at 06/05/22 9604   lactated ringers infusion   Intravenous Continuous Odella Appelhans, MD 75 mL/hr at 06/05/22 0100 Infusion Verify at 06/05/22 0100   ondansetron (ZOFRAN) tablet 4 mg  4 mg Oral Q6H PRN Lorriane Shire, MD       Or   ondansetron (ZOFRAN) injection 4 mg  4 mg Intravenous Q6H PRN Augusten Lipkin, MD       oxyCODONE (Oxy IR/ROXICODONE) immediate release tablet 5-10 mg  5-10 mg Oral Q4H PRN Lorriane Shire, MD   10 mg at 06/05/22 0021   pantoprazole (PROTONIX) injection 40 mg  40 mg Intravenous Daily Atif Chapple, MD   40 mg at 06/04/22 1124   phenol (CHLORASEPTIC) mouth spray 1 spray  1 spray Mouth/Throat PRN Lorriane Shire, MD   1 spray at 06/04/22 2016   polyethylene glycol (MIRALAX / GLYCOLAX) packet 17 g  17 g Oral Daily PRN Lorriane Shire, MD       senna (SENOKOT) tablet 8.6 mg  1 tablet Oral BID Gladys Deckard, MD   8.6 mg at 06/04/22 0845   simethicone (MYLICON) chewable tablet 80 mg  80 mg Oral QID PRN Lorriane Shire, MD   80 mg at 06/04/22 2019      Labs  Recent Labs  Lab 06/04/22 0051 06/05/22 0137  WBC 9.6 7.7  HGB 7.4* 6.8*  HCT 23.8* 20.8*  PLT 217 191    Recent Labs  Lab 06/04/22 0051  NA 136  K 3.8  CL 107  CO2 22  BUN 8  CREATININE 0.48  CALCIUM 8.0*  GLUCOSE 118*     Assessment & Plan:   *GYN: meeting postop goals this AM Use of abdominal binder for when coughing and standing Hgb <7 this AM but clinically appears well - plan for IV iron outpatient with repeat CBC *Pain: APAP/NSAIDs scheduled with oxycodone prn *FEN/GI: SLIV, regular diet *PPx: pantoprazole, IS, SCDs *Dispo: home this morning  Code Status: Full  Code    Lorriane Shire, MD Minimally Invasive Gynecologic Surgery Center for [facility] (Faculty Practice) 06/05/22 7:36 AM

## 2022-06-08 ENCOUNTER — Telehealth: Payer: Self-pay

## 2022-06-08 NOTE — Transitions of Care (Post Inpatient/ED Visit) (Signed)
   06/08/2022  Name: Tracey Gill MRN: [id number] DOB: 1980-12-30  Today's TOC FU Call Status: Unsuccessful Call (1st Attempt) Date: 06/08/22  Attempted to reach the patient regarding the most recent Inpatient/ED visit.  Follow Up Plan: Additional outreach attempts will be made to reach the patient to complete the Transitions of Care (Post Inpatient/ED visit) call.   Signature  Robyne Peers, RN

## 2022-06-09 ENCOUNTER — Telehealth: Payer: Self-pay

## 2022-06-09 NOTE — Transitions of Care (Post Inpatient/ED Visit) (Signed)
   06/09/2022  Name: Tracey Gill MRN: [id number] DOB: Jan 04, 1981  Today's TOC FU Call Status: Today's TOC FU Call Status:: Unsuccessful Call (2nd Attempt) Unsuccessful Call (1st Attempt) Date: 06/08/22 Unsuccessful Call (2nd Attempt) Date: 06/09/22  Attempted to reach the patient regarding the most recent Inpatient/ED visit.  Follow Up Plan: Additional outreach attempts will be made to reach the patient to complete the Transitions of Care (Post Inpatient/ED visit) call.   Signature   Robyne Peers, RN

## 2022-06-10 ENCOUNTER — Telehealth: Payer: Self-pay

## 2022-06-10 NOTE — Transitions of Care (Post Inpatient/ED Visit) (Signed)
   06/10/2022  Name: Tracey Gill MRN: [id number] DOB: 1980/11/28  Today's TOC FU Call Status: Today's TOC FU Call Status:: Unsuccessful Call (3rd Attempt) Unsuccessful Call (1st Attempt) Date: 06/08/22 Unsuccessful Call (2nd Attempt) Date: 06/09/22 Unsuccessful Call (3rd Attempt) Date: 06/10/22  Attempted to reach the patient regarding the most recent Inpatient/ED visit.  Follow Up Plan: No further outreach attempts will be made at this time. We have been unable to contact the patient.  The patient has an appointment at [facility] with Dr Delford Field 07/07/2022.   Signature Audie Box

## 2022-06-13 ENCOUNTER — Encounter: Payer: Self-pay | Admitting: Obstetrics and Gynecology

## 2022-06-13 ENCOUNTER — Encounter: Payer: Self-pay | Admitting: Hematology and Oncology

## 2022-06-13 ENCOUNTER — Other Ambulatory Visit: Payer: Self-pay

## 2022-06-13 ENCOUNTER — Encounter: Payer: Self-pay | Admitting: Critical Care Medicine

## 2022-06-15 ENCOUNTER — Telehealth: Payer: Self-pay | Admitting: General Practice

## 2022-06-15 ENCOUNTER — Encounter: Payer: Self-pay | Admitting: Obstetrics and Gynecology

## 2022-06-15 ENCOUNTER — Other Ambulatory Visit: Payer: Self-pay

## 2022-06-15 ENCOUNTER — Other Ambulatory Visit: Payer: Self-pay | Admitting: General Practice

## 2022-06-15 NOTE — Telephone Encounter (Signed)
Called patient with Debarah Crape assisting with spanish interpretation. Informed her of pathology results and recommendation of iron infusions. Patient verbalized understanding and states since she has been home her feet have been hurting her, making it difficult to walk. She reports pain down both legs into her feet and toes. She states this was happening prior to surgery. Patient reports taking 1 600mg  tylenol twice a day for pain and nothing else. Recommended she take ibuprofen 600mg  q 6 hours for remainder of week. Told patient to call us back Wednesday if things have not improved or worsened. Patient verbalized understanding. Scheduled iron infusion for tomorrow at 9am- patient aware.

## 2022-06-15 NOTE — Addendum Note (Signed)
Addended by: Harlon Ditty on: 06/15/2022 05:16 PM   Modules accepted: Orders

## 2022-06-15 NOTE — Telephone Encounter (Signed)
-----   Message from Lorriane Shire, MD sent at 06/15/2022  9:03 AM EDT ----- Please notify of normal pathology and ensure she has iron transfusions scheduled

## 2022-06-15 NOTE — Addendum Note (Signed)
Addended by: Harlon Ditty on: 06/15/2022 05:12 PM   Modules accepted: Orders

## 2022-06-16 ENCOUNTER — Non-Acute Institutional Stay (HOSPITAL_COMMUNITY)
Admission: RE | Admit: 2022-06-16 | Discharge: 2022-06-16 | Disposition: A | Discharge status: Home / Self Care | Payer: Self-pay | Source: Ambulatory Visit | Attending: Internal Medicine | Admitting: Internal Medicine

## 2022-06-16 DIAGNOSIS — D509 Iron deficiency anemia, unspecified: Secondary | ICD-10-CM | POA: Insufficient documentation

## 2022-06-16 MED ORDER — EPINEPHRINE PF 1 MG/ML IJ SOLN: 0.3000 mg | Freq: Once | INTRAMUSCULAR | Status: DC | PRN

## 2022-06-16 MED ORDER — ALBUTEROL SULFATE (2.5 MG/3ML) 0.083% IN NEBU: 2.5000 mg | INHALATION_SOLUTION | Freq: Once | RESPIRATORY_TRACT | Status: DC | PRN

## 2022-06-16 MED ORDER — METHYLPREDNISOLONE SODIUM SUCC 125 MG IJ SOLR: 125.0000 mg | Freq: Once | INTRAMUSCULAR | Status: DC | PRN

## 2022-06-16 MED ORDER — DIPHENHYDRAMINE HCL 50 MG/ML IJ SOLN: 25.0000 mg | Freq: Once | INTRAMUSCULAR | Status: DC | PRN

## 2022-06-16 MED ORDER — SODIUM CHLORIDE 0.9 % IV SOLN: INTRAVENOUS | Status: DC | PRN

## 2022-06-16 MED ORDER — IRON SUCROSE 500 MG IVPB - SIMPLE MED
500.0000 mg | INTRAVENOUS | Status: DC
  Administered 2022-06-16: 500 mg via INTRAVENOUS
  Filled 2022-06-16: qty 500

## 2022-06-16 MED ORDER — IRON SUCROSE 500 MG IVPB - SIMPLE MED
500.0000 mg | Freq: Once | INTRAVENOUS | Status: DC
  Filled 2022-06-16: qty 275

## 2022-06-16 MED ORDER — SODIUM CHLORIDE 0.9 % IV BOLUS: 500.0000 mL | Freq: Once | INTRAVENOUS | Status: DC | PRN

## 2022-06-16 NOTE — Progress Notes (Signed)
PATIENT CARE CENTER NOTE  Diagnosis: Anemia   Provider: Resa Miner MD  Procedure: Venofer 500 mg (dose #1 of 2)  Note: Pt received Venofer 500 mg via PIV (dose #1 of 2). Pt accompanied by daughter, whom assisted pt with spanish interpretation today. Pt tolerated infusion with no adverse reaction. AVS printed and given to pt. Pt advised that she should return in one week to receive second dose, and advised to schedule appointment at front desk. Pt is alert, oriented, and ambulatory at discharge. Pt taken to car in wheelchair by RN and discharged home with daughter.

## 2022-06-18 ENCOUNTER — Other Ambulatory Visit: Payer: Self-pay

## 2022-06-22 ENCOUNTER — Encounter: Payer: Self-pay | Admitting: Obstetrics and Gynecology

## 2022-06-22 ENCOUNTER — Ambulatory Visit (INDEPENDENT_AMBULATORY_CARE_PROVIDER_SITE_OTHER): Payer: Self-pay | Admitting: Obstetrics and Gynecology

## 2022-06-22 VITALS — BP 121/82 | HR 73 | Wt 135.9 lb

## 2022-06-22 DIAGNOSIS — Z09 Encounter for follow-up examination after completed treatment for conditions other than malignant neoplasm: Secondary | ICD-10-CM

## 2022-06-22 DIAGNOSIS — D62 Acute posthemorrhagic anemia: Secondary | ICD-10-CM

## 2022-06-22 NOTE — Progress Notes (Unsigned)
   POSTOPERATIVE VISIT NOTE   Subjective:     Tracey Gill is a 42 y.o. G3P3003 who presents to the clinic 3 weeks status post  RA-TLH, BS, cysto  for abnormal uterine bleeding and fibroids. Eating a regular diet without difficulty. Bowel movements are normal. Pain is controlled with current analgesics. Medications being used: acetaminophen and ibuprofen (OTC). Incision: doing well  Pain 0/10 Vaginal bleeding: none Resumed sexual acitivity:  Burning in her vagina when she walks Abdominal pain when she needs to have a BM Small amount of pain above umbilicus Left upper and lower abomen  The following portions of the patient's history were reviewed and updated as appropriate: allergies, current medications, past family history, past medical history, past social history, past surgical history, and problem list..   Review of Systems Pertinent items are noted in HPI.    Objective:    BP 121/82   Pulse 73   Wt 135 lb 14.4 oz (61.6 kg)   LMP 04/13/2022 (Within Weeks)   BMI 26.54 kg/m  General:  alert, cooperative, and no distress  Abdomen: soft, appropriately tender, increased tenderness ~4cm above umbilical incision without overlying skin changes  Incision:   healing well, no drainage, no erythema, no hernia, no seroma, no swelling, no dehiscence, incision well approximated  Pelvic:   Exam deferred.    Pathology Results: A. UTERUS AND CERVIX, WITH BILATERAL FALLOPIAN TUBES, HYSTERECTOMY:   Cervix:           Unmarkable.            Negative for dysplasia.        Endometrium:            Disordered proliferative endometrium.            Negative for hyperplasia, atypia or malignancy.        Myometrium:            Leiomyomata.            Negative for malignancy.        Serosa:            Unremarkable.            Negative for malignancy.        Bilateral fallopian tubes:            Benign fimbriated fallopian tubes.            Negative for malignancy.   B. UTERINE  FIBROIDS:       Leiomyomata.    Assessment:   Doing well postoperatively. Operative findings again reviewed. Pathology report discussed.     Plan:   1. Monitor postoperative pain, continue current APAP and NSAIDs 2. IV iron transfusion scheduled, will follow up with interval CBC 3. Activity restrictions: no lifting more than 10 pounds and pelvic rest 4. Anticipated return to work:  6-8 weeks .  5. Follow up as needed   Lorriane Shire, MD Obstetrician & Gynecologist, [facility], [facility] for [employer], [facility]

## 2022-06-23 ENCOUNTER — Non-Acute Institutional Stay (HOSPITAL_COMMUNITY)
Admission: RE | Admit: 2022-06-23 | Discharge: 2022-06-23 | Disposition: A | Discharge status: Home / Self Care | Payer: Self-pay | Source: Ambulatory Visit | Attending: Internal Medicine | Admitting: Internal Medicine

## 2022-06-23 DIAGNOSIS — D62 Acute posthemorrhagic anemia: Secondary | ICD-10-CM | POA: Insufficient documentation

## 2022-06-23 MED ORDER — SODIUM CHLORIDE 0.9 % IV SOLN: INTRAVENOUS | Status: DC | PRN

## 2022-06-23 MED ORDER — DIPHENHYDRAMINE HCL 50 MG/ML IJ SOLN: 25.0000 mg | Freq: Once | INTRAMUSCULAR | Status: DC | PRN

## 2022-06-23 MED ORDER — IRON SUCROSE 500 MG IVPB - SIMPLE MED
500.0000 mg | Freq: Once | INTRAVENOUS | Status: AC
  Administered 2022-06-23: 500 mg via INTRAVENOUS
  Filled 2022-06-23: qty 500

## 2022-06-23 NOTE — Progress Notes (Signed)
PATIENT CARE CENTER NOTE  Diagnosis: Anemia   Provider: Resa Miner MD   Procedure: Venofer 500 mg (dose #2 of 2)   Note: Patient received Venofer 500 mg via PIV (dose #2 of 2). Pt accompanied by daughter, who assisted with spanish interpretation today. Pt tolerated infusion with no adverse reaction. Vital signs stable. AVS offered, but pt declined. Pt is alert, oriented, and ambulatory at discharge. Pt discharged home with daughter.

## 2022-07-06 NOTE — Progress Notes (Unsigned)
Patient ID: Tracey Gill, female   DOB: [birth year], 42 y.o.   MRN: [id number]     Tracey Gill, is a 42 y.o. female  No chief complaint on file.      Subjective:  04/01/22  Saw [name] :  Tracey Gill is a 42 y.o. female here today for BP follow up and continued dizziness related to menorrhagia.  She is being followed by Dr Briscoe Deutscher for menorrhagia and uterine fibroids-recent MRI and Rx Lupron.  The patient says she is unsure how she is to go about getting the Lupron injection. She has not been told her MRI results but I do see she has a f/up appt with Dr Briscoe Deutscher on 04/14/2022.  she denies PICA currently.  Last Hgb 03/12/2022 and was 8.6(up from 6.3).  she has a letter with her that she has been approved for additional iron infusions.  She is currently taking OTC iron bid but still has dizziness.  No tinnitus.  No SOB. She does say that she felt much better after having an iron infusion a few months ago.    05/12/22 This patient seen in return follow-up visit is assisted by video interpreter [name] [phone] and then later [name] [phone]  Patient does arrive with elevated blood pressure 160/92.  She has iron deficiency anemia with menorrhagia she is being scheduled in April for hysterectomy.  She has received multiple iron infusions by hematology below is documentation from the hematology office.  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  Patient is craving mud but has not really consumed this. #4 muscle cramps could be from anemia iron deficiency but will rule out other deficiencies.   PLAN:   -Discussed lab results on 05/01/2022 with patient. CBC showed WBC of 5.5K, hemoglobin improved to 10.5, and platelets of 359K. -patient is scheduled to receive additional iron infusion today.  -continue vitamin B-complex supplements  for next couple months  to support accelerated hematopoiesis. -Patient will not need  to continuing receiving IV iron after surgery -answered all of patient's questions regarding concern for lowered iron levels following her next menstrual cycle -patient does have a hysterectomy scheduled for 06/03/2022 -hemoglobin is at goal to proceed with hysterectomy -discussed option to set up patient for two more infusion session to ensure optimal iron levels prior to surgery, patient is agreeable to this option   FOLLOW-UP: Plz schedule for 2 more doses of Venofer 300mg  weekly starting 1 week from 05/01/2022 RTC with Dr Candise Che with labs in 3 months   Patient needs follow-up with hematology as above and also needs follow-up iron levels.  She states her dizziness has improved.    07/07/22 S/p Hysterectomy  No problems updated.   ALLERGIES: No Known Allergies  PAST MEDICAL HISTORY: Past Medical History:  Diagnosis Date   Anemia    Hypertension    Known health problems: none    Constitutional:   No  weight loss, night sweats,  Fevers, chills, fatigue, lassitude. HEENT:   No headaches,  Difficulty swallowing,  Tooth/dental problems,  Sore throat,                No sneezing, itching, ear ache, nasal congestion, post nasal drip,   CV:  No chest pain,  Orthopnea, PND, swelling in lower extremities, anasarca, dizziness, palpitations  GI  No heartburn, indigestion, abdominal pain, nausea, vomiting, diarrhea, change in bowel  habits, loss of appetite  Resp: No shortness of breath with exertion or at rest.  No excess mucus, no productive cough,  No non-productive cough,  No coughing up of blood.  No change in color of mucus.  No wheezing.  No chest wall deformity  Skin: no rash or lesions.  GU: no dysuria, change in color of urine, no urgency or frequency.  No flank pain.  MS:  No joint pain or swelling.  No decreased range of motion.  No back pain.  Psych:  No change in mood or affect. No  depression or anxiety.  No memory loss.   Objective:   There were no vitals filed for this visit.  Exam General appearance : Awake, alert, not in any distress. Speech Clear. Not toxic looking HEENT: Atraumatic and Normocephalic Neck: Supple, no JVD. No cervical lymphadenopathy.  Chest: Good air entry bilaterally, CTAB.  No rales/rhonchi/wheezing CVS: S1 S2 regular, no murmurs.  Extremities: B/L Lower Ext shows no edema, both legs are warm to touch Neurology: Awake alert, and oriented X 3, CN II-XII intact, Non focal Skin: No Rash  Data Review No results found for: "HGBA1C"  Assessment & Plan  I personally reviewed all images and lab data in the [facility] system as well as any outside material available during this office visit and agree with the  radiology impressions.   No problem-specific Assessment & Plan notes found for this encounter.    There are no diagnoses linked to this encounter.       No follow-ups on file.

## 2022-07-07 ENCOUNTER — Ambulatory Visit: Payer: Self-pay | Attending: Critical Care Medicine | Admitting: Critical Care Medicine

## 2022-07-07 ENCOUNTER — Encounter: Payer: Self-pay | Admitting: Critical Care Medicine

## 2022-07-07 VITALS — BP 113/74 | HR 71 | Wt 140.6 lb

## 2022-07-07 DIAGNOSIS — D252 Subserosal leiomyoma of uterus: Secondary | ICD-10-CM

## 2022-07-07 DIAGNOSIS — D251 Intramural leiomyoma of uterus: Secondary | ICD-10-CM

## 2022-07-07 DIAGNOSIS — D250 Submucous leiomyoma of uterus: Secondary | ICD-10-CM

## 2022-07-07 DIAGNOSIS — I10 Essential (primary) hypertension: Secondary | ICD-10-CM

## 2022-07-07 DIAGNOSIS — N920 Excessive and frequent menstruation with regular cycle: Secondary | ICD-10-CM

## 2022-07-07 DIAGNOSIS — D500 Iron deficiency anemia secondary to blood loss (chronic): Secondary | ICD-10-CM

## 2022-07-07 NOTE — Assessment & Plan Note (Signed)
Resolved with hysterectomy

## 2022-07-07 NOTE — Assessment & Plan Note (Signed)
Blood pressure well-controlled will see how she tolerates reduction of controlled medicines by stopping valsartan and continue amlodipine

## 2022-07-07 NOTE — Assessment & Plan Note (Signed)
Reassess iron and CBC

## 2022-07-07 NOTE — Patient Instructions (Addendum)
Labs today blood counts kidney function iron levels  Stop vasartan Stay on amlodipine  Return Dr Delford Field 5 months  Keep follow up appointments with hematology and gynecology  Laboratorios de hoy recuentos sanguneos funcin renal niveles de hierro  Detener vasartn Seguir tomando amlodipino  Volver Dr Delford Field 5 meses

## 2022-07-08 ENCOUNTER — Telehealth: Payer: Self-pay

## 2022-07-08 LAB — COMPREHENSIVE METABOLIC PANEL
ALT: 21 IU/L (ref 0–32)
AST: 23 IU/L (ref 0–40)
Albumin/Globulin Ratio: 1.7 (ref 1.2–2.2)
Albumin: 4.3 g/dL (ref 3.9–4.9)
Alkaline Phosphatase: 66 IU/L (ref 44–121)
BUN/Creatinine Ratio: 31 — ABNORMAL HIGH (ref 9–23)
BUN: 11 mg/dL (ref 6–20)
Bilirubin Total: 0.2 mg/dL (ref 0.0–1.2)
CO2: 24 mmol/L (ref 20–29)
Calcium: 9.2 mg/dL (ref 8.7–10.2)
Chloride: 100 mmol/L (ref 96–106)
Creatinine, Ser: 0.36 mg/dL — ABNORMAL LOW (ref 0.57–1.00)
Globulin, Total: 2.6 g/dL (ref 1.5–4.5)
Glucose: 95 mg/dL (ref 70–99)
Potassium: 3.6 mmol/L (ref 3.5–5.2)
Sodium: 139 mmol/L (ref 134–144)
Total Protein: 6.9 g/dL (ref 6.0–8.5)
eGFR: 132 mL/min/{1.73_m2} (ref >59)

## 2022-07-08 LAB — CBC WITH DIFFERENTIAL/PLATELET
Basophils Absolute: 0 10*3/uL (ref 0.0–0.2)
Basos: 0 %
EOS (ABSOLUTE): 0.4 10*3/uL (ref 0.0–0.4)
Eos: 6 %
Hematocrit: 43.1 % (ref 34.0–46.6)
Hemoglobin: 13.5 g/dL (ref 11.1–15.9)
Immature Grans (Abs): 0 10*3/uL (ref 0.0–0.1)
Immature Granulocytes: 0 %
Lymphocytes Absolute: 2.1 10*3/uL (ref 0.7–3.1)
Lymphs: 35 %
MCH: 28.2 pg (ref 26.6–33.0)
MCHC: 31.3 g/dL — ABNORMAL LOW (ref 31.5–35.7)
MCV: 90 fL (ref 79–97)
Monocytes Absolute: 0.4 10*3/uL (ref 0.1–0.9)
Monocytes: 7 %
Neutrophils Absolute: 3.2 10*3/uL (ref 1.4–7.0)
Neutrophils: 52 %
Platelets: 334 10*3/uL (ref 150–450)
RBC: 4.78 x10E6/uL (ref 3.77–5.28)
RDW: 17.3 % — ABNORMAL HIGH (ref 11.7–15.4)
WBC: 6.1 10*3/uL (ref 3.4–10.8)

## 2022-07-08 LAB — IRON,TIBC AND FERRITIN PANEL
Ferritin: 565 ng/mL — ABNORMAL HIGH (ref 15–150)
Iron Saturation: 30 % (ref 15–55)
Iron: 78 ug/dL (ref 27–159)
Total Iron Binding Capacity: 258 ug/dL (ref 250–450)
UIBC: 180 ug/dL (ref 131–425)

## 2022-07-08 NOTE — Telephone Encounter (Signed)
Pt was called and is aware of results, DOB was confirmed.  Interpreter id Loraine Leriche [phone]

## 2022-07-08 NOTE — Telephone Encounter (Signed)
-----   Message from Storm Frisk, MD sent at 07/08/2022  6:15 AM EDT ----- Let pt know all labs normal she is no longer anemic her iron level are now normal, kidney liver normal

## 2022-07-08 NOTE — Progress Notes (Signed)
Let pt know all labs normal she is no longer anemic her iron level are now normal, kidney liver normal

## 2022-07-09 ENCOUNTER — Other Ambulatory Visit (HOSPITAL_COMMUNITY): Payer: Self-pay

## 2022-07-20 ENCOUNTER — Encounter: Payer: Self-pay | Admitting: Obstetrics and Gynecology

## 2022-07-20 ENCOUNTER — Ambulatory Visit (INDEPENDENT_AMBULATORY_CARE_PROVIDER_SITE_OTHER): Payer: Self-pay | Admitting: Obstetrics and Gynecology

## 2022-07-20 ENCOUNTER — Other Ambulatory Visit (HOSPITAL_COMMUNITY)
Admission: RE | Admit: 2022-07-20 | Discharge: 2022-07-20 | Disposition: A | Discharge status: Home / Self Care | Payer: Self-pay | Source: Ambulatory Visit | Attending: Obstetrics and Gynecology | Admitting: Obstetrics and Gynecology

## 2022-07-20 ENCOUNTER — Other Ambulatory Visit: Payer: Self-pay

## 2022-07-20 VITALS — BP 132/85 | HR 81 | Wt 142.1 lb

## 2022-07-20 DIAGNOSIS — D252 Subserosal leiomyoma of uterus: Secondary | ICD-10-CM

## 2022-07-20 DIAGNOSIS — Z758 Other problems related to medical facilities and other health care: Secondary | ICD-10-CM

## 2022-07-20 DIAGNOSIS — Z603 Acculturation difficulty: Secondary | ICD-10-CM

## 2022-07-20 DIAGNOSIS — N920 Excessive and frequent menstruation with regular cycle: Secondary | ICD-10-CM

## 2022-07-20 DIAGNOSIS — N898 Other specified noninflammatory disorders of vagina: Secondary | ICD-10-CM

## 2022-07-20 DIAGNOSIS — N921 Excessive and frequent menstruation with irregular cycle: Secondary | ICD-10-CM

## 2022-07-20 DIAGNOSIS — Z09 Encounter for follow-up examination after completed treatment for conditions other than malignant neoplasm: Secondary | ICD-10-CM

## 2022-07-20 DIAGNOSIS — D62 Acute posthemorrhagic anemia: Secondary | ICD-10-CM

## 2022-07-20 DIAGNOSIS — D500 Iron deficiency anemia secondary to blood loss (chronic): Secondary | ICD-10-CM

## 2022-07-20 DIAGNOSIS — D251 Intramural leiomyoma of uterus: Secondary | ICD-10-CM

## 2022-07-20 NOTE — Progress Notes (Addendum)
   POSTOPERATIVE VISIT NOTE   Subjective:     Mairen Bohan is a 42 y.o. [id number] who presents to the clinic 6 weeks status post  RA-TLH, BS, cysto  for abnormal uterine bleeding and fibroids. Eating a regular diet without difficulty. Bowel movements are normal. The patient is not having any pain. Incision: doing well  Vaginal bleeding: none Resumed sexual acitivity: not yet  Has been having more BM than voiding Intermittent abdominal pain with BM but not with each BM   Wants daughter to interpret  The following portions of the patient's history were reviewed and updated as appropriate: allergies, current medications, past family history, past medical history, past social history, past surgical history, and problem list..   Review of Systems Pertinent items are noted in HPI.    Objective:    BP (!) 132/92   Pulse 81   Wt 142 lb 1.6 oz (64.5 kg)   LMP 04/13/2022 (Within Weeks)   BMI 27.75 kg/m  General:  alert, cooperative, and no distress  Abdomen: soft, non-tender  Incision:   healing well, no drainage, no erythema, no hernia, no seroma, no swelling, no dehiscence, incision well approximated  Pelvic:    Normal external genitalia, vaginal discharge noted, visually intact cuff, cuff intact on palpation with mild tenderness    Pathology Results: FINAL MICROSCOPIC DIAGNOSIS:   A. UTERUS AND CERVIX, WITH BILATERAL FALLOPIAN TUBES, HYSTERECTOMY:   Cervix:           Unmarkable.            Negative for dysplasia.        Endometrium:            Disordered proliferative endometrium.            Negative for hyperplasia, atypia or malignancy.        Myometrium:            Leiomyomata.            Negative for malignancy.        Serosa:            Unremarkable.            Negative for malignancy.        Bilateral fallopian tubes:            Benign fimbriated fallopian tubes.            Negative for malignancy.   B. UTERINE FIBROIDS:       Leiomyomata.     Assessment:   Doing well postoperatively. Operative findings again reviewed. Pathology report discussed.   1. Postop check Doing well and meeting postop goals Reviewed pathology  Discussed using oil to remove remaining skin glue  2. Acute blood loss as cause of postoperative anemia Resolved, most recent CBC normal No additional iron needed  3. Menorrhagia with regular cycle 4. Menorrhagia with irregular cycle 5. Intramural and subserous leiomyoma of uterus 6. Iron deficiency anemia due to chronic blood loss Resolved  7. Language barrier Signed documentation to allow daughter to be interpreter     Plan:   1. Continue any current medications. 2. Anemia and AUB resolved  3. Activity restrictions:  4 additional weeks of pelvic rest 4. Anticipated return to work: now. 5. Vaginitis swab collected given sensitivity of cuff 6. Return for annuals or prn   Lorriane Shire, MD Obstetrician & Gynecologist, [facility] for [employer], [facility]

## 2022-07-21 LAB — CERVICOVAGINAL ANCILLARY ONLY
Bacterial Vaginitis (gardnerella): NEGATIVE
Candida Glabrata: NEGATIVE
Candida Vaginitis: NEGATIVE
Comment: NEGATIVE
Comment: NEGATIVE
Comment: NEGATIVE

## 2022-07-23 ENCOUNTER — Telehealth: Payer: Self-pay | Admitting: Hematology

## 2022-07-23 NOTE — Telephone Encounter (Signed)
Rescheduled appointments per incoming call per patient. She is aware of the changes made to her upcoming appointment.

## 2022-07-24 ENCOUNTER — Inpatient Hospital Stay: Payer: Self-pay

## 2022-07-24 ENCOUNTER — Inpatient Hospital Stay: Payer: Self-pay | Admitting: Hematology

## 2022-08-06 ENCOUNTER — Encounter: Payer: Self-pay | Admitting: General Practice

## 2022-08-14 ENCOUNTER — Other Ambulatory Visit: Payer: Self-pay

## 2022-08-14 DIAGNOSIS — D500 Iron deficiency anemia secondary to blood loss (chronic): Secondary | ICD-10-CM

## 2022-08-17 ENCOUNTER — Inpatient Hospital Stay (HOSPITAL_BASED_OUTPATIENT_CLINIC_OR_DEPARTMENT_OTHER): Payer: Self-pay | Admitting: Hematology

## 2022-08-17 ENCOUNTER — Inpatient Hospital Stay: Payer: Self-pay | Attending: Hematology

## 2022-08-17 VITALS — BP 145/88 | HR 87 | Temp 98.1°F | Resp 18 | Ht 60.0 in | Wt 143.5 lb

## 2022-08-17 DIAGNOSIS — D259 Leiomyoma of uterus, unspecified: Secondary | ICD-10-CM | POA: Insufficient documentation

## 2022-08-17 DIAGNOSIS — D500 Iron deficiency anemia secondary to blood loss (chronic): Secondary | ICD-10-CM | POA: Insufficient documentation

## 2022-08-17 DIAGNOSIS — R252 Cramp and spasm: Secondary | ICD-10-CM | POA: Insufficient documentation

## 2022-08-17 DIAGNOSIS — Z90710 Acquired absence of both cervix and uterus: Secondary | ICD-10-CM | POA: Insufficient documentation

## 2022-08-17 DIAGNOSIS — F5089 Other specified eating disorder: Secondary | ICD-10-CM | POA: Insufficient documentation

## 2022-08-17 DIAGNOSIS — N920 Excessive and frequent menstruation with regular cycle: Secondary | ICD-10-CM | POA: Insufficient documentation

## 2022-08-17 LAB — IRON AND IRON BINDING CAPACITY (CC-WL,HP ONLY)
Iron: 33 ug/dL (ref 28–170)
Saturation Ratios: 15 % (ref 10.4–31.8)
TIBC: 227 ug/dL — ABNORMAL LOW (ref 250–450)
UIBC: 194 ug/dL (ref 148–442)

## 2022-08-17 LAB — VITAMIN B12: Vitamin B-12: 318 pg/mL (ref 180–914)

## 2022-08-17 LAB — CBC WITH DIFFERENTIAL (CANCER CENTER ONLY)
Abs Immature Granulocytes: 0 10*3/uL (ref 0.00–0.07)
Basophils Absolute: 0 10*3/uL (ref 0.0–0.1)
Basophils Relative: 0 %
Eosinophils Absolute: 0.1 10*3/uL (ref 0.0–0.5)
Eosinophils Relative: 3 %
HCT: 38.3 % (ref 36.0–46.0)
Hemoglobin: 12.9 g/dL (ref 12.0–15.0)
Immature Granulocytes: 0 %
Lymphocytes Relative: 35 %
Lymphs Abs: 1.3 10*3/uL (ref 0.7–4.0)
MCH: 29.7 pg (ref 26.0–34.0)
MCHC: 33.7 g/dL (ref 30.0–36.0)
MCV: 88 fL (ref 80.0–100.0)
Monocytes Absolute: 0.4 10*3/uL (ref 0.1–1.0)
Monocytes Relative: 10 %
Neutro Abs: 2 10*3/uL (ref 1.7–7.7)
Neutrophils Relative %: 52 %
Platelet Count: 253 10*3/uL (ref 150–400)
RBC: 4.35 MIL/uL (ref 3.87–5.11)
RDW: 13.3 % (ref 11.5–15.5)
WBC Count: 3.9 10*3/uL — ABNORMAL LOW (ref 4.0–10.5)
nRBC: 0 % (ref 0.0–0.2)

## 2022-08-17 LAB — CMP (CANCER CENTER ONLY)
ALT: 18 U/L (ref 0–44)
AST: 15 U/L (ref 15–41)
Albumin: 3.9 g/dL (ref 3.5–5.0)
Alkaline Phosphatase: 57 U/L (ref 38–126)
Anion gap: 6 (ref 5–15)
BUN: 10 mg/dL (ref 6–20)
CO2: 28 mmol/L (ref 22–32)
Calcium: 8.3 mg/dL — ABNORMAL LOW (ref 8.9–10.3)
Chloride: 104 mmol/L (ref 98–111)
Creatinine: 0.49 mg/dL (ref 0.44–1.00)
GFR, Estimated: >60 mL/min (ref >60)
Glucose, Bld: 123 mg/dL — ABNORMAL HIGH (ref 70–99)
Potassium: 3.4 mmol/L — ABNORMAL LOW (ref 3.5–5.1)
Sodium: 138 mmol/L (ref 135–145)
Total Bilirubin: 0.3 mg/dL (ref 0.3–1.2)
Total Protein: 6.7 g/dL (ref 6.5–8.1)

## 2022-08-17 LAB — FERRITIN: Ferritin: 263 ng/mL (ref 11–307)

## 2022-08-17 NOTE — Progress Notes (Signed)
[facility]   HEMATOLOGY/ONCOLOGY CLINIC NOTE  Date of Service: 08/17/2022  Patient Care Team: Storm Frisk, MD as PCP - General (Pulmonary Disease) Johney Maine, MD as Consulting Physician (Hematology)  CHIEF COMPLAINTS/PURPOSE OF CONSULTATION:  Evaluation and management of Iron deficiency anemia  HISTORY OF PRESENTING ILLNESS:  Tracey Gill is a wonderful 42 y.o. female who has been referred to [place] by Dr Georgian Co PA-C for evaluation and management of iron deficiency anemia and consideration of IV iron.  INTERVAL HISTORY:  Tracey Gill is a 42 y.o. female here for continued evaluation and management of Iron deficiency anemia. Interpretation is provided by spanish interpreter.   Patient was last seen by me on 05/01/2022 and she complained of occasional dizziness and lower abdominal pain.   Patient is accompanied by a family member during this visit. She reports she has been doing well overall without any new or severe medical concerns since our last visit. She had her hysterectomy since our last visit without any complication. However, she was anemic after her surgery. She notes that she has lost weight after her surgery.   Patient has discontinued blood pressure medication since our last visit. Her blood pressure during this visit is 145/88 with pulse rate of 87.   She denies dizziness, fatigue, infection issues, fever, chills, night sweats, bone pain, abdominal pain, chest pain, back pain. However, she does complain of leg swelling when she stands for a long period of time.   MEDICAL HISTORY:  Past Medical History:  Diagnosis Date   Anemia    Hypertension    Intramural, submucous, and subserous leiomyoma of uterus 06/03/2022   Known health problems: none     SURGICAL HISTORY: Past Surgical History:  Procedure Laterality Date   CYSTOSCOPY N/A 06/03/2022   Procedure: CYSTOSCOPY;  Surgeon: Lorriane Shire, MD;  Location: [facility] OR;  Service:  Gynecology;  Laterality: N/A;   HYSTERECTOMY ABDOMINAL WITH SALPINGECTOMY Bilateral 06/03/2022   Procedure: HYSTERECTOMY ABDOMINAL WITH SALPINGECTOMY;  Surgeon: Lorriane Shire, MD;  Location: [facility] OR;  Service: Gynecology;  Laterality: Bilateral;  MINI LAPAROTOMY FOR UTERUS REMOVAL   removal of benign right breast mass Left    ROBOTIC ASSISTED TOTAL HYSTERECTOMY Bilateral 06/03/2022   Procedure: XI ROBOTIC ASSISTED TOTAL HYSTERECTOMY WITH SALPINGECTOMY   WITH MINI LAPAROTOMY;  Surgeon: Lorriane Shire, MD;  Location: [facility] OR;  Service: Gynecology;  Laterality: Bilateral;  TAP BLOCK Wants the 30 degree robotic scope and the vessel sealer    SOCIAL HISTORY: Social History   Socioeconomic History   Marital status: Married    Spouse name: Not on file   Number of children: 3   Years of education: Not on file   Highest education level: Not on file  Occupational History   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  Tobacco Use   Smoking status: Never   Smokeless tobacco: Never  Vaping Use   Vaping Use: Never used  Substance and Sexual Activity   Alcohol use: No   Drug use: No   Sexual activity: Yes  Other Topics Concern   Not on file  Social History Narrative   Not on file   Social Determinants of Health   Financial Resource Strain: Not on file  Food Insecurity: Not on file  Transportation Needs: Not on file  Physical Activity: Not on file  Stress: Not on file  Social Connections: Not on file  Intimate Partner Violence: Not on file    FAMILY HISTORY: Family History  Problem Relation Age of Onset  Diabetes Mother    Diabetes Father    Heart disease Father    Diabetes Sister     ALLERGIES:  has No Known Allergies.  MEDICATIONS:  Current Outpatient Medications  Medication Sig Dispense Refill   acetaminophen (TYLENOL) 500 MG tablet Take 2 tablets (1,000 mg total) by mouth every 6 (six) hours. 120 tablet 2   amLODipine (NORVASC) 10 MG tablet Take 1 tablet (10 mg total) by mouth  daily. To lower blood pressure 90 tablet 1   Blood Pressure Monitoring (BLOOD PRESSURE KIT) DEVI Use to measure blood pressure 1 each 0   ferrous sulfate 325 (65 FE) MG tablet Take 1 tablet (325 mg total) by mouth 3 (three) times daily with meals. 90 tablet 1   ibuprofen (ADVIL) 600 MG tablet Take 1 tablet (600 mg total) by mouth every 6 (six) hours. 60 tablet 2   Multiple Vitamin (MULTIVITAMIN) tablet Take 1 tablet by mouth daily.     phenol (CHLORASEPTIC) 1.4 % LIQD Use as directed 1 spray in the mouth or throat as needed for throat irritation / pain. (Patient not taking: Reported on 07/20/2022) 177 mL 0   polyethylene glycol powder (GLYCOLAX/MIRALAX) 17 GM/SCOOP powder Take 1 capful  (17 g) by mouth daily as needed for mild constipation. 238 g 0   senna (SENOKOT) 8.6 MG TABS tablet Take 1 tablet (8.6 mg total) by mouth 2 (two) times daily. (Patient not taking: Reported on 07/20/2022) 120 tablet 0   simethicone (MYLICON) 80 MG chewable tablet Chew 1 tablet (80 mg total) by mouth 4 (four) times daily as needed for flatulence. 30 tablet 0   No current facility-administered medications for this visit.    REVIEW OF SYSTEMS:    10 Point review of Systems was done is negative except as noted above.   PHYSICAL EXAMINATION: ECOG PERFORMANCE STATUS: 1 - Symptomatic but completely ambulatory  . Vitals:   08/17/22 1358  BP: (!) 145/88  Pulse: 87  Resp: 18  Temp: 98.1 F (36.7 C)  SpO2: 99%   Filed Weights   08/17/22 1358  Weight: 143 lb 8 oz (65.1 kg)  .Body mass index is 28.03 kg/m. GENERAL:alert, in no acute distress and comfortable SKIN: no acute rashes, no significant lesions EYES: conjunctiva are pink and non-injected, sclera anicteric OROPHARYNX: MMM, no exudates, no oropharyngeal erythema or ulceration NECK: supple, no JVD LYMPH:  no palpable lymphadenopathy in the cervical, axillary or inguinal regions LUNGS: clear to auscultation b/l with normal respiratory effort HEART: regular  rate & rhythm ABDOMEN:  normoactive bowel sounds , non tender, not distended. Extremity: no pedal edema PSYCH: alert & oriented x 3 with fluent speech NEURO: no focal motor/sensory deficits   LABORATORY DATA:  I have reviewed the data as listed  .    Latest Ref Rng & Units 08/17/2022    1:39 PM 07/07/2022    5:10 PM 06/05/2022    1:37 AM  CBC  WBC 4.0 - 10.5 K/uL 3.9  6.1  7.7   Hemoglobin 12.0 - 15.0 g/dL 16.1  09.6  6.8   Hematocrit 36.0 - 46.0 % 38.3  43.1  20.8   Platelets 150 - 400 K/uL 253  334  191     .    Latest Ref Rng & Units 08/17/2022    1:39 PM 07/07/2022    5:10 PM 06/04/2022   12:51 AM  CMP  Glucose 70 - 99 mg/dL 045  95  409   BUN 6 - 20  mg/dL 10  11  8    Creatinine 0.44 - 1.00 mg/dL 7.82  9.56  2.13   Sodium 135 - 145 mmol/L 138  139  136   Potassium 3.5 - 5.1 mmol/L 3.4  3.6  3.8   Chloride 98 - 111 mmol/L 104  100  107   CO2 22 - 32 mmol/L 28  24  22    Calcium 8.9 - 10.3 mg/dL 8.3  9.2  8.0   Total Protein 6.5 - 8.1 g/dL 6.7  6.9    Total Bilirubin 0.3 - 1.2 mg/dL 0.3  0.2    Alkaline Phos 38 - 126 U/L 57  66    AST 15 - 41 U/L 15  23    ALT 0 - 44 U/L 18  21     . Lab Results  Component Value Date   IRON 33 08/17/2022   TIBC 227 (L) 08/17/2022   IRONPCTSAT 15 08/17/2022   (Iron and TIBC)  Lab Results  Component Value Date   FERRITIN 263 08/17/2022     RADIOGRAPHIC STUDIES: I have personally reviewed the radiological images as listed and agreed with the findings in the report. No results found.  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  Patient is craving mud but has not really consumed this. #4 muscle cramps could be from anemia iron deficiency but will rule out other deficiencies.  PLAN: -Discussed lab results from today, 08/17/2022, with the patient and her family member. CBC  and CMP are stable overall.  -ferritin 263 -no indication for additional IV iron at this time. -since patient has had a hysterectomy the source of blood loss anemia has been addressed. Will discharge the patient back to PCP -Recommend checking blood pressure in the morning. -Answered all of patient's questions.  -Continue to follow-up with PCP  FOLLOW-UP: RTC with PCP  The total time spent in the appointment was 20 minutes* .  All of the patient's questions were answered with apparent satisfaction. The patient knows to call the clinic with any problems, questions or concerns.   Wyvonnia Lora MD MS AAHIVMS [facility] Hematology/Oncology Physician [facility]  .*Total Encounter Time as defined by the Centers for Medicare and Medicaid Services includes, in addition to the face-to-face time of a patient visit (documented in the note above) non-face-to-face time: obtaining and reviewing outside history, ordering and reviewing medications, tests or procedures, care coordination (communications with other health care professionals or caregivers) and documentation in the medical record.   I, Ok Edwards, am acting as a Neurosurgeon for Wyvonnia Lora, MD.  .I have reviewed the above documentation for accuracy and completeness, and I agree with the above. Johney Maine MD

## 2022-08-23 ENCOUNTER — Encounter: Payer: Self-pay | Admitting: Hematology and Oncology

## 2022-08-23 ENCOUNTER — Encounter: Payer: Self-pay | Admitting: Critical Care Medicine

## 2022-10-13 ENCOUNTER — Ambulatory Visit: Payer: Self-pay | Attending: Critical Care Medicine | Admitting: Critical Care Medicine

## 2022-10-13 ENCOUNTER — Encounter: Payer: Self-pay | Admitting: Critical Care Medicine

## 2022-10-13 ENCOUNTER — Other Ambulatory Visit: Payer: Self-pay

## 2022-10-13 ENCOUNTER — Encounter: Payer: Self-pay | Admitting: Hematology and Oncology

## 2022-10-13 VITALS — BP 152/89 | HR 74 | Wt 145.8 lb

## 2022-10-13 DIAGNOSIS — Z90710 Acquired absence of both cervix and uterus: Secondary | ICD-10-CM

## 2022-10-13 DIAGNOSIS — Z23 Encounter for immunization: Secondary | ICD-10-CM

## 2022-10-13 DIAGNOSIS — M25561 Pain in right knee: Secondary | ICD-10-CM

## 2022-10-13 DIAGNOSIS — R739 Hyperglycemia, unspecified: Secondary | ICD-10-CM

## 2022-10-13 DIAGNOSIS — G8929 Other chronic pain: Secondary | ICD-10-CM

## 2022-10-13 DIAGNOSIS — D252 Subserosal leiomyoma of uterus: Secondary | ICD-10-CM

## 2022-10-13 DIAGNOSIS — M25562 Pain in left knee: Secondary | ICD-10-CM

## 2022-10-13 DIAGNOSIS — E7800 Pure hypercholesterolemia, unspecified: Secondary | ICD-10-CM

## 2022-10-13 DIAGNOSIS — D500 Iron deficiency anemia secondary to blood loss (chronic): Secondary | ICD-10-CM

## 2022-10-13 DIAGNOSIS — D251 Intramural leiomyoma of uterus: Secondary | ICD-10-CM

## 2022-10-13 DIAGNOSIS — I10 Essential (primary) hypertension: Secondary | ICD-10-CM

## 2022-10-13 MED ORDER — VALSARTAN-HYDROCHLOROTHIAZIDE 80-12.5 MG PO TABS
1.0000 | ORAL_TABLET | Freq: Every day | ORAL | 3 refills | Status: DC
  Filled 2022-10-13: qty 90, 90d supply, fill #0
  Filled 2023-06-24 (×2): qty 90, 90d supply, fill #1

## 2022-10-13 MED ORDER — AMLODIPINE BESYLATE 10 MG PO TABS
10.0000 mg | ORAL_TABLET | Freq: Every day | ORAL | 1 refills | Status: DC
Start: 2022-10-13 — End: 2023-04-06
  Filled 2022-10-13: qty 90, 90d supply, fill #0
  Filled 2023-01-11 (×2): qty 90, 90d supply, fill #1

## 2022-10-13 NOTE — Patient Instructions (Addendum)
REturn 2 weeks for blood pressure recheck with RN See Dr Delford Field 4 weeks Labs today Start Valsartan Hct one daily, stay on amlodipine daily Follow lifestyle handout for diet exercise  Regrese 2 semanas para volver a controlar la presin arterial con un enfermero registrado Ver Dr. Delford Field 4 semanas Laboratorios hoy Comience con Valsartan Hct una vez al da, contine con amlodipino diariamente Siga el folleto sobre estilo de vida para hacer ejercicios dietticos.

## 2022-10-13 NOTE — Progress Notes (Unsigned)
Patient ID: Tracey Gill, female   DOB: 1981-02-08, 42 y.o.   MRN: [id number]     Tracey Gill, is a 42 y.o. female  No chief complaint on file.      Subjective:  04/01/22  Saw [name] :  Tracey Gill is a 42 y.o. female here today for BP follow up and continued dizziness related to menorrhagia.  She is being followed by Dr Briscoe Deutscher for menorrhagia and uterine fibroids-recent MRI and Rx Lupron.  The patient says she is unsure how she is to go about getting the Lupron injection. She has not been told her MRI results but I do see she has a f/up appt with Dr Briscoe Deutscher on 04/14/2022.  she denies PICA currently.  Last Hgb 03/12/2022 and was 8.6(up from 6.3).  she has a letter with her that she has been approved for additional iron infusions.  She is currently taking OTC iron bid but still has dizziness.  No tinnitus.  No SOB. She does say that she felt much better after having an iron infusion a few months ago.    05/12/22 This patient seen in return follow-up visit is assisted by video interpreter [name] [phone] and then later [name] [phone]  Patient does arrive with elevated blood pressure 160/92.  She has iron deficiency anemia with menorrhagia she is being scheduled in April for hysterectomy.  She has received multiple iron infusions by hematology below is documentation from the hematology office.  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  Patient is craving mud but has not really consumed this. #4 muscle cramps could be from anemia iron deficiency but will rule out other deficiencies.   PLAN:   -Discussed lab results on 05/01/2022 with patient. CBC showed WBC of 5.5K, hemoglobin improved to 10.5, and platelets of 359K. -patient is scheduled to receive additional iron infusion today.  -continue vitamin B-complex supplements  for next couple months  to support accelerated hematopoiesis. -Patient will not need  to continuing receiving IV iron after surgery -answered all of patient's questions regarding concern for lowered iron levels following her next menstrual cycle -patient does have a hysterectomy scheduled for 06/03/2022 -hemoglobin is at goal to proceed with hysterectomy -discussed option to set up patient for two more infusion session to ensure optimal iron levels prior to surgery, patient is agreeable to this option   FOLLOW-UP: Plz schedule for 2 more doses of Venofer 300mg  weekly starting 1 week from 05/01/2022 RTC with Dr Candise Che with labs in 3 months   Patient needs follow-up with hematology as above and also needs follow-up iron levels.  She states her dizziness has improved.    07/07/22 The patient seen in return follow-up visit assisted by video Spanish interpreter [name] [phone] Since the last visit the patient has had a total hysterectomy which is helped with the menorrhagia.  Patient's been following with hematology and she got 2 iron infusions and transfusion while in the hospital.  Because of low blood pressure likely needs to take reduced dose of blood pressure medication.  She needs follow-up iron and CBC at this visit.  If she bends over she does have some degree of dizziness. 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  Patient is craving mud but has not really consumed this. #4 muscle cramps could be from anemia iron deficiency but will rule out other deficiencies.   PLAN: -Discussed lab results from today, 08/17/2022, with the patient and her family member. CBC and CMP are stable overall.  -ferritin 263 -no indication for additional IV iron at this time. -since patient has had a hysterectomy the source of blood loss  anemia has been addressed. Will discharge the patient back to PCP -Recommend checking blood pressure in the morning. -Answered all of patient's questions.  -Continue to follow-up with PCP   No problems updated.    ALLERGIES: No Known Allergies  PAST MEDICAL HISTORY: Past Medical History:  Diagnosis Date   Anemia    Hypertension    Intramural, submucous, and subserous leiomyoma of uterus 06/03/2022   Known health problems: none    Constitutional:   No  weight loss, night sweats,  Fevers, chills, fatigue, lassitude. HEENT:   No headaches,  Difficulty swallowing,  Tooth/dental problems,  Sore throat,                No sneezing, itching, ear ache, nasal congestion, post nasal drip,   CV:  No chest pain,  Orthopnea, PND, swelling in lower extremities, anasarca, dizziness, palpitations  GI  No heartburn, indigestion, abdominal pain, nausea, vomiting, diarrhea, change in bowel habits, loss of appetite  Resp: No shortness of breath with exertion or at rest.  No excess mucus, no productive cough,  No non-productive cough,  No coughing up of blood.  No change in color of mucus.  No wheezing.  No chest wall deformity  Skin: no rash or lesions.  GU: no dysuria, change in color of urine, no urgency or frequency.  No flank pain.  MS:  No joint pain or swelling.  No decreased range of motion.  No back pain.  Psych:  No change in mood or affect. No depression or anxiety.  No memory loss.   Objective:   There were no vitals filed for this visit.   Exam There were no vitals filed for this visit.   Gen: Pleasant, well-nourished, in no distress,  normal affect  ENT: No lesions,  mouth clear,  oropharynx clear, no postnasal drip  Neck: No JVD, no TMG, no carotid bruits  Lungs: No use of accessory muscles, no dullness to percussion, clear without rales or rhonchi  Cardiovascular: RRR, heart sounds normal, no murmur or gallops, no peripheral edema  Abdomen: soft and NT, no HSM,  BS  normal  Musculoskeletal: No deformities, no cyanosis or clubbing  Neuro: alert, non focal  Skin: Warm, no lesions or rashes  No results found.   Assessment & Plan  I personally reviewed all images and lab data in the [facility] system as well as any outside material available during this office visit and agree with the  radiology impressions.   No problem-specific Assessment & Plan notes found for this encounter.     There are no diagnoses linked to this encounter.        No follow-ups on file.

## 2022-10-14 ENCOUNTER — Encounter: Payer: Self-pay | Admitting: Hematology and Oncology

## 2022-10-14 ENCOUNTER — Other Ambulatory Visit: Payer: Self-pay | Admitting: Critical Care Medicine

## 2022-10-14 ENCOUNTER — Other Ambulatory Visit: Payer: Self-pay

## 2022-10-14 ENCOUNTER — Encounter: Payer: Self-pay | Admitting: Critical Care Medicine

## 2022-10-14 DIAGNOSIS — Z90710 Acquired absence of both cervix and uterus: Secondary | ICD-10-CM | POA: Insufficient documentation

## 2022-10-14 LAB — LIPID PANEL
Chol/HDL Ratio: 5.1 ratio — ABNORMAL HIGH (ref 0.0–4.4)
Cholesterol, Total: 242 mg/dL — ABNORMAL HIGH (ref 100–199)
HDL: 47 mg/dL (ref >39)
LDL Chol Calc (NIH): 154 mg/dL — ABNORMAL HIGH (ref 0–99)
Triglycerides: 222 mg/dL — ABNORMAL HIGH (ref 0–149)
VLDL Cholesterol Cal: 41 mg/dL — ABNORMAL HIGH (ref 5–40)

## 2022-10-14 LAB — CBC WITH DIFFERENTIAL/PLATELET
Basophils Absolute: 0 10*3/uL (ref 0.0–0.2)
Basos: 0 %
EOS (ABSOLUTE): 0.3 10*3/uL (ref 0.0–0.4)
Eos: 4 %
Hematocrit: 41.4 % (ref 34.0–46.6)
Hemoglobin: 13.9 g/dL (ref 11.1–15.9)
Immature Grans (Abs): 0 10*3/uL (ref 0.0–0.1)
Immature Granulocytes: 0 %
Lymphocytes Absolute: 2.8 10*3/uL (ref 0.7–3.1)
Lymphs: 38 %
MCH: 31 pg (ref 26.6–33.0)
MCHC: 33.6 g/dL (ref 31.5–35.7)
MCV: 92 fL (ref 79–97)
Monocytes Absolute: 0.6 10*3/uL (ref 0.1–0.9)
Monocytes: 8 %
Neutrophils Absolute: 3.6 10*3/uL (ref 1.4–7.0)
Neutrophils: 50 %
Platelets: 322 10*3/uL (ref 150–450)
RBC: 4.48 x10E6/uL (ref 3.77–5.28)
RDW: 13 % (ref 11.7–15.4)
WBC: 7.3 10*3/uL (ref 3.4–10.8)

## 2022-10-14 LAB — COMPREHENSIVE METABOLIC PANEL
ALT: 15 IU/L (ref 0–32)
AST: 15 IU/L (ref 0–40)
Albumin: 4.5 g/dL (ref 3.9–4.9)
Alkaline Phosphatase: 62 IU/L (ref 44–121)
BUN/Creatinine Ratio: 21 (ref 9–23)
BUN: 12 mg/dL (ref 6–24)
Bilirubin Total: <0.2 mg/dL (ref 0.0–1.2)
CO2: 25 mmol/L (ref 20–29)
Calcium: 9.2 mg/dL (ref 8.7–10.2)
Chloride: 102 mmol/L (ref 96–106)
Creatinine, Ser: 0.56 mg/dL — ABNORMAL LOW (ref 0.57–1.00)
Globulin, Total: 2.4 g/dL (ref 1.5–4.5)
Glucose: 94 mg/dL (ref 70–99)
Potassium: 3.9 mmol/L (ref 3.5–5.2)
Sodium: 141 mmol/L (ref 134–144)
Total Protein: 6.9 g/dL (ref 6.0–8.5)
eGFR: 118 mL/min/{1.73_m2} (ref >59)

## 2022-10-14 LAB — HEMOGLOBIN A1C
Est. average glucose Bld gHb Est-mCnc: 100 mg/dL
Hgb A1c MFr Bld: 5.1 % (ref 4.8–5.6)

## 2022-10-14 MED ORDER — ATORVASTATIN CALCIUM 10 MG PO TABS
10.0000 mg | ORAL_TABLET | Freq: Every day | ORAL | 3 refills | Status: DC
  Filled 2022-10-14: qty 90, 90d supply, fill #0

## 2022-10-14 NOTE — Assessment & Plan Note (Signed)
Patient with chondromalacia given knee exercises continue with Voltaren gel

## 2022-10-14 NOTE — Progress Notes (Signed)
Let pt know no diabetes,  kidney liver normal  no anemia  cholesterol very high I recommend starting low dose cholesterol pill daily sent to pharmacy  follow plant based diet we discussed

## 2022-10-14 NOTE — Assessment & Plan Note (Signed)
Resolved due to no further bleeding

## 2022-10-14 NOTE — Assessment & Plan Note (Signed)
Resolved had a hysterectomy in April no further bleeding

## 2022-10-14 NOTE — Assessment & Plan Note (Signed)
Hypertension not well-controlled check labs add valsartan HCT 80/12.5 and continue amlodipine  Return short-term blood pressure recheck

## 2022-10-15 ENCOUNTER — Telehealth: Payer: Self-pay

## 2022-10-15 NOTE — Telephone Encounter (Signed)
Pt was called and vm was left. Information was sent to nurse pool    Interpreter: [place]  [phone]

## 2022-10-15 NOTE — Telephone Encounter (Signed)
-----   Message from Shan Levans sent at 10/14/2022  6:16 AM EDT ----- Let pt know no diabetes,  kidney liver normal  no anemia  cholesterol very high I recommend starting low dose cholesterol pill daily sent to pharmacy  follow plant based diet we discussed

## 2022-10-21 ENCOUNTER — Other Ambulatory Visit: Payer: Self-pay

## 2023-01-09 ENCOUNTER — Other Ambulatory Visit: Payer: Self-pay

## 2023-01-09 ENCOUNTER — Encounter (HOSPITAL_COMMUNITY): Payer: Self-pay

## 2023-01-09 ENCOUNTER — Emergency Department (HOSPITAL_COMMUNITY)
Admission: EM | Admit: 2023-01-09 | Discharge: 2023-01-10 | Disposition: A | Discharge status: Home / Self Care | Payer: Self-pay | Attending: Emergency Medicine | Admitting: Emergency Medicine

## 2023-01-09 DIAGNOSIS — N83202 Unspecified ovarian cyst, left side: Secondary | ICD-10-CM | POA: Insufficient documentation

## 2023-01-09 DIAGNOSIS — Z79899 Other long term (current) drug therapy: Secondary | ICD-10-CM | POA: Insufficient documentation

## 2023-01-09 DIAGNOSIS — R1032 Left lower quadrant pain: Secondary | ICD-10-CM

## 2023-01-09 DIAGNOSIS — I10 Essential (primary) hypertension: Secondary | ICD-10-CM | POA: Insufficient documentation

## 2023-01-09 MED ORDER — ONDANSETRON HCL 4 MG/2ML IJ SOLN
4.0000 mg | Freq: Once | INTRAMUSCULAR | Status: AC | PRN
  Administered 2023-01-09: 4 mg via INTRAVENOUS
  Filled 2023-01-09: qty 2

## 2023-01-09 MED ORDER — MORPHINE SULFATE (PF) 4 MG/ML IV SOLN
4.0000 mg | Freq: Once | INTRAVENOUS | Status: AC
  Administered 2023-01-10: 4 mg via INTRAVENOUS
  Filled 2023-01-09: qty 1

## 2023-01-09 NOTE — ED Triage Notes (Signed)
Patient reports left abdominal pain that started one week ago, with some nausea, no vomiting. Patient has a hysterectomy 6 months ago and feels like something has ruptured. Denies vaginal bleeding. LBM was this morning, normal. Denies blood in stool or diarrhea. Patient is unable to sit still in triage. Patient is here with her daughter. Primarily spanish speaking.

## 2023-01-09 NOTE — ED Provider Notes (Signed)
  [place] EMERGENCY DEPARTMENT AT [facility] Provider Note   CSN: [id number] Arrival date & time: 01/09/23  2326     History {Add pertinent medical, surgical, social history, OB history to HPI:1} Chief Complaint  Patient presents with   Abdominal Pain    Amberley Kohlman is a 42 y.o. female.  The history is provided by the patient, a relative and medical records. A language interpreter was used.  Abdominal Pain Jessa Basore is a 42 y.o. female who presents to the Emergency Department complaining of *** S/p hysterectomy 6 months ago.  Llq pain 1 week ago some pain mid day, went away.  Today returned stronger.   Subj fever two days ago.  No vomiting. Has nausea. No dysuria. No vaginal discharge.  Hx/o HTN. No allergies.      Home Medications Prior to Admission medications   Medication Sig Start Date End Date Taking? Authorizing Provider  amLODipine (NORVASC) 10 MG tablet Take 1 tablet (10 mg total) by mouth daily. To lower blood pressure 10/13/22   Storm Frisk, MD  atorvastatin (LIPITOR) 10 MG tablet Take 1 tablet (10 mg total) by mouth daily. 10/14/22   Storm Frisk, MD  valsartan-hydrochlorothiazide (DIOVAN-HCT) 80-12.5 MG tablet Take 1 tablet by mouth daily. 10/13/22   Storm Frisk, MD      Allergies    Patient has no known allergies.    Review of Systems   Review of Systems  Gastrointestinal:  Positive for abdominal pain.    Physical Exam Updated Vital Signs BP (!) 150/90   Pulse 82   Temp 98.5 F (36.9 C) (Oral)   Resp 18   Ht 5\' 3"  (1.6 m)   Wt 64.9 kg   LMP 04/13/2022 (Within Weeks)   SpO2 100%   BMI 25.33 kg/m  Physical Exam  ED Results / Procedures / Treatments   Labs (all labs ordered are listed, but only abnormal results are displayed) Labs Reviewed  LIPASE, BLOOD  COMPREHENSIVE METABOLIC PANEL  CBC  URINALYSIS, ROUTINE W REFLEX MICROSCOPIC    EKG None  Radiology No results  found.  Procedures Procedures  {Document cardiac monitor, telemetry assessment procedure when appropriate:1}  Medications Ordered in ED Medications  ondansetron (ZOFRAN) injection 4 mg (4 mg Intravenous Given 01/09/23 2345)    ED Course/ Medical Decision Making/ A&P   {   Click here for ABCD2, HEART and other calculatorsREFRESH Note before signing :1}                              Medical Decision Making Amount and/or Complexity of Data Reviewed Labs: ordered.  Risk Prescription drug management.   ***  {Document critical care time when appropriate:1} {Document review of labs and clinical decision tools ie heart score, Chads2Vasc2 etc:1}  {Document your independent review of radiology images, and any outside records:1} {Document your discussion with family members, caretakers, and with consultants:1} {Document social determinants of health affecting pt's care:1} {Document your decision making why or why not admission, treatments were needed:1} Final Clinical Impression(s) / ED Diagnoses Final diagnoses:  None    Rx / DC Orders ED Discharge Orders     None

## 2023-01-10 ENCOUNTER — Emergency Department (HOSPITAL_COMMUNITY): Payer: Self-pay

## 2023-01-10 ENCOUNTER — Other Ambulatory Visit: Payer: Self-pay

## 2023-01-10 ENCOUNTER — Encounter: Payer: Self-pay | Admitting: Critical Care Medicine

## 2023-01-10 ENCOUNTER — Encounter: Payer: Self-pay | Admitting: Hematology and Oncology

## 2023-01-10 LAB — URINALYSIS, ROUTINE W REFLEX MICROSCOPIC
Bilirubin Urine: NEGATIVE
Glucose, UA: NEGATIVE mg/dL
Hgb urine dipstick: NEGATIVE
Ketones, ur: NEGATIVE mg/dL
Leukocytes,Ua: NEGATIVE
Nitrite: NEGATIVE
Protein, ur: NEGATIVE mg/dL
Specific Gravity, Urine: 1.023 (ref 1.005–1.030)
pH: 8 (ref 5.0–8.0)

## 2023-01-10 LAB — COMPREHENSIVE METABOLIC PANEL
ALT: 18 U/L (ref 0–44)
AST: 18 U/L (ref 15–41)
Albumin: 4.6 g/dL (ref 3.5–5.0)
Alkaline Phosphatase: 59 U/L (ref 38–126)
Anion gap: 8 (ref 5–15)
BUN: 19 mg/dL (ref 6–20)
CO2: 25 mmol/L (ref 22–32)
Calcium: 8.7 mg/dL — ABNORMAL LOW (ref 8.9–10.3)
Chloride: 101 mmol/L (ref 98–111)
Creatinine, Ser: 0.65 mg/dL (ref 0.44–1.00)
GFR, Estimated: >60 mL/min (ref >60)
Glucose, Bld: 111 mg/dL — ABNORMAL HIGH (ref 70–99)
Potassium: 3.5 mmol/L (ref 3.5–5.1)
Sodium: 134 mmol/L — ABNORMAL LOW (ref 135–145)
Total Bilirubin: 0.4 mg/dL (ref <1.2)
Total Protein: 8.1 g/dL (ref 6.5–8.1)

## 2023-01-10 LAB — CBC
HCT: 41.7 % (ref 36.0–46.0)
Hemoglobin: 14.4 g/dL (ref 12.0–15.0)
MCH: 31.4 pg (ref 26.0–34.0)
MCHC: 34.5 g/dL (ref 30.0–36.0)
MCV: 90.8 fL (ref 80.0–100.0)
Platelets: 284 10*3/uL (ref 150–400)
RBC: 4.59 MIL/uL (ref 3.87–5.11)
RDW: 12.1 % (ref 11.5–15.5)
WBC: 8.8 10*3/uL (ref 4.0–10.5)
nRBC: 0 % (ref 0.0–0.2)

## 2023-01-10 LAB — LIPASE, BLOOD: Lipase: 51 U/L (ref 11–51)

## 2023-01-10 MED ORDER — IBUPROFEN 800 MG PO TABS
800.0000 mg | ORAL_TABLET | Freq: Three times a day (TID) | ORAL | 0 refills | Status: DC | PRN
  Filled 2023-01-10: qty 21, 7d supply, fill #0

## 2023-01-10 MED ORDER — IOHEXOL 300 MG/ML  SOLN
100.0000 mL | Freq: Once | INTRAMUSCULAR | Status: AC | PRN
  Administered 2023-01-10: 100 mL via INTRAVENOUS

## 2023-01-10 MED ORDER — MORPHINE SULFATE (PF) 4 MG/ML IV SOLN
4.0000 mg | Freq: Once | INTRAVENOUS | Status: AC
  Administered 2023-01-10: 4 mg via INTRAVENOUS
  Filled 2023-01-10: qty 1

## 2023-01-10 MED ORDER — ONDANSETRON 4 MG PO TBDP
4.0000 mg | ORAL_TABLET | Freq: Three times a day (TID) | ORAL | 0 refills | Status: DC | PRN
  Filled 2023-01-10: qty 12, 4d supply, fill #0

## 2023-01-10 MED ORDER — KETOROLAC TROMETHAMINE 15 MG/ML IJ SOLN
15.0000 mg | Freq: Once | INTRAMUSCULAR | Status: AC
  Administered 2023-01-10: 15 mg via INTRAVENOUS
  Filled 2023-01-10: qty 1

## 2023-01-11 ENCOUNTER — Other Ambulatory Visit: Payer: Self-pay

## 2023-01-11 ENCOUNTER — Encounter: Payer: Self-pay | Admitting: Hematology and Oncology

## 2023-01-11 ENCOUNTER — Encounter: Payer: Self-pay | Admitting: Critical Care Medicine

## 2023-02-25 ENCOUNTER — Encounter: Payer: Self-pay | Admitting: Obstetrics and Gynecology

## 2023-02-25 ENCOUNTER — Encounter: Payer: Self-pay | Admitting: Hematology and Oncology

## 2023-02-25 ENCOUNTER — Ambulatory Visit: Payer: Self-pay | Admitting: Obstetrics and Gynecology

## 2023-02-25 ENCOUNTER — Other Ambulatory Visit: Payer: Self-pay

## 2023-02-25 ENCOUNTER — Encounter: Payer: Self-pay | Admitting: Critical Care Medicine

## 2023-02-25 VITALS — BP 126/84 | HR 67 | Wt 144.1 lb

## 2023-02-25 DIAGNOSIS — M791 Myalgia, unspecified site: Secondary | ICD-10-CM

## 2023-02-25 DIAGNOSIS — Z1331 Encounter for screening for depression: Secondary | ICD-10-CM

## 2023-02-25 MED ORDER — CYCLOBENZAPRINE HCL 10 MG PO TABS
5.0000 mg | ORAL_TABLET | Freq: Two times a day (BID) | ORAL | 1 refills | Status: DC | PRN
  Filled 2023-02-25 – 2023-03-17 (×2): qty 30, 30d supply, fill #0

## 2023-02-25 NOTE — Progress Notes (Signed)
 GYNECOLOGY VISIT  Patient name: Tracey Gill MRN [id number]  Date of birth: 05/10/1980 Chief Complaint:   Gynecologic Exam   History:  Camden Mazzaferro is a 43 y.o. [phone] being seen today for follow up of abdominal pain and ovarian cyst.  Has had the pain again since the ED. Will take the pain meds given and apply heat. Since the ED visit, had it one time . When the pain starts, it starts low and gets worse, and surpristed her. The pain occurs in just one area in LLQ. When walking she will have the pain. When she walks, she noticed it starts to swell and then the pain comes and then she take the pain medication and heating pad and it improves. Will have vomiting with the pain. The pain is so severe that she feels she has to have a BM but then doesn't[' have to go.   Past Medical History:  Diagnosis Date   Anemia    Hypertension    Intramural and subserous leiomyoma of uterus 02/03/2021   Intramural, submucous, and subserous leiomyoma of uterus 06/03/2022   Known health problems: none     Past Surgical History:  Procedure Laterality Date   CYSTOSCOPY N/A 06/03/2022   Procedure: CYSTOSCOPY;  Surgeon: Jeralyn Crutch, MD;  Location: [facility] OR;  Service: Gynecology;  Laterality: N/A;   HYSTERECTOMY ABDOMINAL WITH SALPINGECTOMY Bilateral 06/03/2022   Procedure: HYSTERECTOMY ABDOMINAL WITH SALPINGECTOMY;  Surgeon: Jeralyn Crutch, MD;  Location: [facility] OR;  Service: Gynecology;  Laterality: Bilateral;  MINI LAPAROTOMY FOR UTERUS REMOVAL   removal of benign right breast mass Left    ROBOTIC ASSISTED TOTAL HYSTERECTOMY Bilateral 06/03/2022   Procedure: XI ROBOTIC ASSISTED TOTAL HYSTERECTOMY WITH SALPINGECTOMY   WITH MINI LAPAROTOMY;  Surgeon: Jeralyn Crutch, MD;  Location: [facility] OR;  Service: Gynecology;  Laterality: Bilateral;  TAP BLOCK Wants the 30 degree robotic scope and the vessel sealer    The following portions of the patient's history were reviewed and updated as  appropriate: allergies, current medications, past family history, past medical history, past social history, past surgical history and problem list.   Health Maintenance:   Last pap     Component Value Date/Time   DIAGPAP  06/16/2021 1046    - Negative for intraepithelial lesion or malignancy (NILM)   HPVHIGH Negative 06/16/2021 1046   ADEQPAP  06/16/2021 1046    Satisfactory for evaluation; transformation zone component PRESENT.    High Risk HPV: Positive  Adequacy:  Satisfactory for evaluation, transformation zone component PRESENT  Diagnosis:  Atypical squamous cells of undetermined significance (ASC-US )  Last mammogram: non on file   Review of Systems:  Pertinent items are noted in HPI. Comprehensive review of systems was otherwise negative.   Objective:  Physical Exam BP 126/84   Pulse 67   Wt 144 lb 1.6 oz (65.4 kg)   LMP 04/13/2022 (Within Weeks)   BMI 25.53 kg/m    Physical Exam Vitals and nursing note reviewed.  Constitutional:      Appearance: Normal appearance.  HENT:     Head: Normocephalic and atraumatic.  Pulmonary:     Effort: Pulmonary effort is normal.  Abdominal:     Comments: + [place]  Skin:    General: Skin is warm and dry.  Neurological:     General: No focal deficit present.     Mental Status: She is alert.  Psychiatric:        Mood and Affect: Mood normal.  Behavior: Behavior normal.        Thought Content: Thought content normal.        Judgment: Judgment normal.         Assessment & Plan:   1. Myalgia (Primary) Exam and history consistent with abdominal wall myalgia. Recommend trial of muscle relaxer as well as continuing application of heat. No clear TP noted on exam but pain limited to singular quadrant, can re-examine and complete TP injections if found on follow up. If pain worsens or continues despite interventions, can order pelvic ultrasound.  - cyclobenzaprine  (FLEXERIL ) 10 MG tablet; Take 0.5 tablets (5 mg total)  by mouth 2 (two) times daily as needed.  Dispense: 30 tablet; Refill: 1  Routine preventative health maintenance measures emphasized.  Carter Quarry, MD [facility] for [facility], [facility]

## 2023-03-08 ENCOUNTER — Other Ambulatory Visit: Payer: Self-pay

## 2023-03-17 ENCOUNTER — Other Ambulatory Visit: Payer: Self-pay

## 2023-03-17 ENCOUNTER — Encounter: Payer: Self-pay | Admitting: Critical Care Medicine

## 2023-03-17 ENCOUNTER — Encounter: Payer: Self-pay | Admitting: Hematology and Oncology

## 2023-04-06 ENCOUNTER — Encounter: Payer: Self-pay | Admitting: Critical Care Medicine

## 2023-04-06 ENCOUNTER — Other Ambulatory Visit: Payer: Self-pay

## 2023-04-06 ENCOUNTER — Other Ambulatory Visit: Payer: Self-pay | Admitting: Critical Care Medicine

## 2023-04-06 ENCOUNTER — Ambulatory Visit
Admission: EM | Admit: 2023-04-06 | Discharge: 2023-04-06 | Disposition: A | Discharge status: Home / Self Care | Payer: Self-pay | Attending: Family Medicine | Admitting: Family Medicine

## 2023-04-06 ENCOUNTER — Encounter: Payer: Self-pay | Admitting: Hematology and Oncology

## 2023-04-06 DIAGNOSIS — I10 Essential (primary) hypertension: Secondary | ICD-10-CM

## 2023-04-06 DIAGNOSIS — R300 Dysuria: Secondary | ICD-10-CM | POA: Insufficient documentation

## 2023-04-06 LAB — POCT URINALYSIS DIP (MANUAL ENTRY)
Bilirubin, UA: NEGATIVE
Blood, UA: NEGATIVE
Glucose, UA: 100 mg/dL — AB
Nitrite, UA: POSITIVE — AB
Protein Ur, POC: NEGATIVE mg/dL
Spec Grav, UA: 1.01 (ref 1.010–1.025)
Urobilinogen, UA: 1 U/dL
pH, UA: 5 (ref 5.0–8.0)

## 2023-04-06 MED ORDER — CEPHALEXIN 500 MG PO CAPS
500.0000 mg | ORAL_CAPSULE | Freq: Two times a day (BID) | ORAL | 0 refills | Status: AC
  Filled 2023-04-06: qty 14, 7d supply, fill #0

## 2023-04-06 MED ORDER — AMLODIPINE BESYLATE 10 MG PO TABS
10.0000 mg | ORAL_TABLET | Freq: Every day | ORAL | 0 refills | Status: DC
  Filled 2023-04-06: qty 30, 30d supply, fill #0

## 2023-04-06 NOTE — Discharge Instructions (Addendum)
The clinic will contact you with results of the urine culture done today if positive.  Start Keflex twice daily for 7 days.  You may continue over-the-counter Azo.  Lots of fluids.  Please follow-up with your PCP if your symptoms do not improve.  Please go to the ER if you develop any worsening symptoms.  Hope you feel better soon!

## 2023-04-06 NOTE — ED Provider Notes (Addendum)
[facility]    CSN: [id number] Arrival date & time: 04/06/23  1441      History   Chief Complaint No chief complaint on file.   HPI Tracey Gill is a 43 y.o. female presents for dysuria.  Patient is accompanied by family who helps to translate as patient speaks some [place] and some [place].  Patient reports 1 week of urinary burning, urgency, frequency and suprapubic pressure.  Denies hematuria, fevers, nausea/vomiting, flank pain.  No vaginal discharge or STD concern.  No history of recurrent UTIs.  She has been taking Azo and cranberry juice OTC.  Patient has had a hysterectomy.  No other concerns at this time.  HPI  Past Medical History:  Diagnosis Date   Anemia    Hypertension    Intramural and subserous leiomyoma of uterus 02/03/2021   Intramural, submucous, and subserous leiomyoma of uterus 06/03/2022   Known health problems: none     Patient Active Problem List   Diagnosis Date Noted   Status post hysterectomy 10/14/2022   Chronic pain of both knees 01/13/2022   Iron deficiency anemia due to chronic blood loss 08/04/2021   Menorrhagia 06/16/2021   Elevated LDL cholesterol level 10/01/2020   Essential hypertension 09/16/2020   BMI 27.0-27.9,adult 09/16/2020    Past Surgical History:  Procedure Laterality Date   CYSTOSCOPY N/A 06/03/2022   Procedure: CYSTOSCOPY;  Surgeon: Lorriane Shire, MD;  Location: [facility] OR;  Service: Gynecology;  Laterality: N/A;   HYSTERECTOMY ABDOMINAL WITH SALPINGECTOMY Bilateral 06/03/2022   Procedure: HYSTERECTOMY ABDOMINAL WITH SALPINGECTOMY;  Surgeon: Lorriane Shire, MD;  Location: [facility] OR;  Service: Gynecology;  Laterality: Bilateral;  MINI LAPAROTOMY FOR UTERUS REMOVAL   removal of benign right breast mass Left    ROBOTIC ASSISTED TOTAL HYSTERECTOMY Bilateral 06/03/2022   Procedure: XI ROBOTIC ASSISTED TOTAL HYSTERECTOMY WITH SALPINGECTOMY   WITH MINI LAPAROTOMY;  Surgeon: Lorriane Shire, MD;  Location: [facility] OR;   Service: Gynecology;  Laterality: Bilateral;  TAP BLOCK Wants the 30 degree robotic scope and the vessel sealer    OB History     Gravida  3   Para  3   Term  3   Preterm      AB      Living  3      SAB      IAB      Ectopic      Multiple      Live Births               Home Medications    Prior to Admission medications   Medication Sig Start Date End Date Taking? Authorizing Provider  amLODipine (NORVASC) 10 MG tablet Take 1 tablet (10 mg total) by mouth daily. To lower blood pressure 10/13/22  Yes Storm Frisk, MD  atorvastatin (LIPITOR) 10 MG tablet Take 1 tablet (10 mg total) by mouth daily. 10/14/22  Yes Storm Frisk, MD  cephALEXin (KEFLEX) 500 MG capsule Take 1 capsule (500 mg total) by mouth 2 (two) times daily for 7 days. 04/06/23 04/13/23 Yes Radford Pax, NP  cyclobenzaprine (FLEXERIL) 10 MG tablet Take 0.5 tablets (5 mg total) by mouth 2 (two) times daily as needed. 02/25/23   Lorriane Shire, MD  ibuprofen (ADVIL) 800 MG tablet Take 1 tablet (800 mg total) by mouth every 8 (eight) hours as needed. 01/10/23   Tilden Fossa, MD  ondansetron (ZOFRAN-ODT) 4 MG disintegrating tablet Dissolve 1 tablet (4 mg total) by mouth every 8 (eight)  hours as needed for nausea or vomiting. 01/10/23   Tilden Fossa, MD  valsartan-hydrochlorothiazide (DIOVAN-HCT) 80-12.5 MG tablet Take 1 tablet by mouth daily. Patient not taking: Reported on 02/25/2023 10/13/22   Storm Frisk, MD    Family History Family History  Problem Relation Age of Onset   Diabetes Mother    Diabetes Father    Heart disease Father    Diabetes Sister     Social History Social History   Tobacco Use   Smoking status: Never   Smokeless tobacco: Never  Vaping Use   Vaping status: Never Used  Substance Use Topics   Alcohol use: No   Drug use: No     Allergies   Patient has no known allergies.   Review of Systems Review of Systems  Genitourinary:  Positive for  dysuria.     Physical Exam Triage Vital Signs ED Triage Vitals  Encounter Vitals Group     BP 04/06/23 1538 128/83     Systolic BP Percentile --      Diastolic BP Percentile --      Pulse Rate 04/06/23 1538 72     Resp 04/06/23 1538 16     Temp 04/06/23 1538 98 F (36.7 C)     Temp Source 04/06/23 1538 Oral     SpO2 04/06/23 1538 98 %     Weight --      Height --      Head Circumference --      Peak Flow --      Pain Score 04/06/23 1535 7     Pain Loc --      Pain Education --      Exclude from Growth Chart --    No data found.  Updated Vital Signs BP 128/83 (BP Location: Right Arm)   Pulse 72   Temp 98 F (36.7 C) (Oral)   Resp 16   LMP 04/13/2022 (Within Weeks)   SpO2 98%   Visual Acuity Right Eye Distance:   Left Eye Distance:   Bilateral Distance:    Right Eye Near:   Left Eye Near:    Bilateral Near:     Physical Exam Vitals and nursing note reviewed.  Constitutional:      Appearance: Normal appearance.  HENT:     Head: Normocephalic and atraumatic.  Eyes:     Pupils: Pupils are equal, round, and reactive to light.  Cardiovascular:     Rate and Rhythm: Normal rate.  Pulmonary:     Effort: Pulmonary effort is normal.  Abdominal:     Tenderness: There is no right CVA tenderness or left CVA tenderness.  Skin:    General: Skin is warm and dry.  Neurological:     General: No focal deficit present.     Mental Status: She is alert and oriented to person, place, and time.  Psychiatric:        Mood and Affect: Mood normal.        Behavior: Behavior normal.      UC Treatments / Results  Labs (all labs ordered are listed, but only abnormal results are displayed) Labs Reviewed  POCT URINALYSIS DIP (MANUAL ENTRY) - Abnormal; Notable for the following components:      Result Value   Color, UA orange (*)    Glucose, UA =100 (*)    Ketones, POC UA trace (5) (*)    Nitrite, UA Positive (*)    Leukocytes, UA Trace (*)    All  other components  within normal limits  URINE CULTURE    EKG   Radiology No results found.  Procedures Procedures (including critical care time)  Medications Ordered in UC Medications - No data to display  Initial Impression / Assessment and Plan / UC Course  I have reviewed the triage vital signs and the nursing notes.  Pertinent labs & imaging results that were available during my care of the patient were reviewed by me and considered in my medical decision making (see chart for details).     Reviewed exam and symptoms with patient no red flags.  Patient did take Azo, skewing point-of-care UA.  Will send for culture and treat based on her symptoms.  Start Keflex twice daily for 7 days.  Encourage fluids and rest.  PCP follow-up if symptoms do not improve.  ER precautions reviewed and patient verbalized understanding. Final Clinical Impressions(s) / UC Diagnoses   Final diagnoses:  Dysuria     Discharge Instructions      The clinic will contact you with results of the urine culture done today if positive.  Start Keflex twice daily for 7 days.  You may continue over-the-counter Azo.  Lots of fluids.  Please follow-up with your PCP if your symptoms do not improve.  Please go to the ER if you develop any worsening symptoms.  Hope you feel better soon!    ED Prescriptions     Medication Sig Dispense Auth. Provider   cephALEXin (KEFLEX) 500 MG capsule Take 1 capsule (500 mg total) by mouth 2 (two) times daily for 7 days. 14 capsule Radford Pax, NP      PDMP not reviewed this encounter.   Radford Pax, NP 04/06/23 1614    Radford Pax, NP 04/06/23 [phone]

## 2023-04-06 NOTE — ED Triage Notes (Signed)
Pt presents to UC for c/o lower abd pain, dysuria, burning x1 week. Azo and cranberry juice do not help.

## 2023-04-07 ENCOUNTER — Other Ambulatory Visit: Payer: Self-pay

## 2023-04-07 ENCOUNTER — Encounter: Payer: Self-pay | Admitting: Critical Care Medicine

## 2023-04-07 ENCOUNTER — Encounter: Payer: Self-pay | Admitting: Hematology and Oncology

## 2023-04-07 LAB — URINE CULTURE: Culture: NO GROWTH

## 2023-04-12 ENCOUNTER — Other Ambulatory Visit: Payer: Self-pay

## 2023-05-05 ENCOUNTER — Other Ambulatory Visit: Payer: Self-pay | Admitting: Family Medicine

## 2023-05-05 DIAGNOSIS — I10 Essential (primary) hypertension: Secondary | ICD-10-CM

## 2023-05-06 ENCOUNTER — Other Ambulatory Visit: Payer: Self-pay

## 2023-06-24 ENCOUNTER — Encounter: Payer: Self-pay | Admitting: Hematology and Oncology

## 2023-06-24 ENCOUNTER — Other Ambulatory Visit: Payer: Self-pay | Admitting: Family Medicine

## 2023-06-24 ENCOUNTER — Other Ambulatory Visit: Payer: Self-pay

## 2023-06-24 DIAGNOSIS — I10 Essential (primary) hypertension: Secondary | ICD-10-CM

## 2023-06-28 ENCOUNTER — Other Ambulatory Visit: Payer: Self-pay | Admitting: Family Medicine

## 2023-06-28 DIAGNOSIS — I10 Essential (primary) hypertension: Secondary | ICD-10-CM

## 2023-06-29 ENCOUNTER — Other Ambulatory Visit: Payer: Self-pay

## 2023-06-29 ENCOUNTER — Other Ambulatory Visit: Payer: Self-pay | Admitting: Family Medicine

## 2023-06-29 DIAGNOSIS — I10 Essential (primary) hypertension: Secondary | ICD-10-CM

## 2023-06-30 ENCOUNTER — Encounter: Payer: Self-pay | Admitting: Hematology and Oncology

## 2023-06-30 ENCOUNTER — Other Ambulatory Visit: Payer: Self-pay

## 2023-06-30 MED ORDER — AMLODIPINE BESYLATE 10 MG PO TABS
10.0000 mg | ORAL_TABLET | Freq: Every day | ORAL | 0 refills | Status: DC
  Filled 2023-06-30: qty 30, 30d supply, fill #0

## 2023-07-01 ENCOUNTER — Other Ambulatory Visit: Payer: Self-pay

## 2023-07-12 NOTE — Progress Notes (Unsigned)
 Patient ID: Tracey Gill, female   DOB: 10-25-1980, 43 y.o.   MRN: [id number]     Tracey Gill, is a 43 y.o. female  No chief complaint on file.      Subjective:  04/01/22  Saw [name] :  Tracey Gill is a 43 y.o. female here today for BP follow up and continued dizziness related to menorrhagia.  She is being followed by Dr Elester Grim for menorrhagia and uterine fibroids-recent MRI and Rx Lupron .  The patient says she is unsure how she is to go about getting the Lupron  injection. She has not been told her MRI results but I do see she has a f/up appt with Dr Elester Grim on 04/14/2022.  she denies PICA currently.  Last Hgb 03/12/2022 and was 8.6(up from 6.3).  she has a letter with her that she has been approved for additional iron  infusions.  She is currently taking OTC iron  bid but still has dizziness.  No tinnitus.  No SOB. She does say that she felt much better after having an iron  infusion a few months ago.    05/12/22 This patient seen in return follow-up visit is assisted by video interpreter Arnetta Lank [phone] and then later Isabella [phone]  Patient does arrive with elevated blood pressure 160/92.  She has iron  deficiency anemia with menorrhagia she is being scheduled in April for hysterectomy.  She has received multiple iron  infusions by hematology below is documentation from the hematology office.  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  Patient is craving mud but has not really consumed this. #4 muscle cramps could be from anemia iron  deficiency but will rule out other deficiencies.   PLAN:   -Discussed lab results on 05/01/2022 with patient. CBC showed WBC of 5.5K, hemoglobin improved to 10.5, and platelets of 359K. -patient is scheduled to receive additional iron  infusion today.  -continue vitamin B-complex supplements  for next couple months  to support accelerated hematopoiesis. -Patient will not need  to continuing receiving IV iron  after surgery -answered all of patient's questions regarding concern for lowered iron  levels following her next menstrual cycle -patient does have a hysterectomy scheduled for 06/03/2022 -hemoglobin is at goal to proceed with hysterectomy -discussed option to set up patient for two more infusion session to ensure optimal iron  levels prior to surgery, patient is agreeable to this option   FOLLOW-UP: Plz schedule for 2 more doses of Venofer  300mg  weekly starting 1 week from 05/01/2022 RTC with Dr Salomon Cree with labs in 3 months   Patient needs follow-up with hematology as above and also needs follow-up iron  levels.  She states her dizziness has improved.    07/07/22 The patient seen in return follow-up visit assisted by video Spanish interpreter [name] [phone] Since the last visit the patient has had a total hysterectomy which is helped with the menorrhagia.  Patient's been following with hematology and she got 2 iron  infusions and transfusion while in the hospital.  Because of low blood pressure likely needs to take reduced dose of blood pressure medication.  She needs follow-up iron  and CBC at this visit.  If she bends over she does have some degree of dizziness. S/p Hysterectomy  10/13/22 This 43 year old woman returns for evaluation of iron  deficiency anemia and hypertension hyperlipidemia.  Patient also had been sent to orthopedics for evaluation of chronic pain in the knees.  This visit  was assisted by [place] interpreter Ace Abu [phone].  Blood pressure on arrival is elevated 152/82.  The patient is only on the amlodipine  10 mg daily.  She does not measure blood pressure at home.  Patient with iron  deficiency anemia due to menorrhagia but now has had a hysterectomy no longer bleeding as she was.  Iron  levels are back to normal as are hemoglobin.  Below is documentation from last  hematology visit. 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  Patient is craving mud but has not really consumed this. #4 muscle cramps could be from anemia iron  deficiency but will rule out other deficiencies.   PLAN: -Discussed lab results from today, 08/17/2022, with the patient and her family member. CBC and CMP are stable overall.  -ferritin 263 -no indication for additional IV iron  at this time. -since patient has had a hysterectomy the source of blood loss anemia has been addressed. Will discharge the patient back to PCP -Recommend checking blood pressure in the morning. -Answered all of patient's questions.  -Continue to follow-up with PCP   Patient has had elevated cholesterol but has not been on medication.  07/15/23  No problems updated.     ALLERGIES: No Known Allergies  PAST MEDICAL HISTORY: Past Medical History:  Diagnosis Date   Anemia    Hypertension    Intramural and subserous leiomyoma of uterus 02/03/2021   Intramural, submucous, and subserous leiomyoma of uterus 06/03/2022   Known health problems: none    Constitutional:   No  weight loss, night sweats,  Fevers, chills, fatigue, lassitude. HEENT:   No headaches,  Difficulty swallowing,  Tooth/dental problems,  Sore throat,                No sneezing, itching, ear ache, nasal congestion, post nasal drip,   CV:  No chest pain,  Orthopnea, PND, swelling in lower extremities, anasarca, dizziness, palpitations  GI  No heartburn, indigestion, abdominal pain, nausea, vomiting, diarrhea, change in bowel habits, loss of appetite  Resp: No shortness of breath with exertion or at rest.  No excess mucus, no productive cough,  No non-productive cough,  No coughing up of blood.  No change in color of mucus.  No wheezing.  No chest wall deformity  Skin: no rash or  lesions.  GU: no dysuria, change in color of urine, no urgency or frequency.  No flank pain.  MS:  No joint pain or swelling.  No decreased range of motion.  No back pain.  Psych:  No change in mood or affect. No depression or anxiety.  No memory loss.  Objective:    Exam There were no vitals filed for this visit.    Gen: Pleasant, well-nourished, in no distress,  normal affect  ENT: No lesions,  mouth clear,  oropharynx clear, no postnasal drip  Neck: No JVD, no TMG, no carotid bruits  Lungs: No use of accessory muscles, no dullness to percussion, clear without rales or rhonchi  Cardiovascular: RRR, heart sounds normal, no murmur or gallops, no peripheral edema  Abdomen: soft and NT, no HSM,  BS normal  Musculoskeletal: No deformities, no cyanosis or clubbing  Neuro: alert, non focal  Skin: Warm, no lesions or rashes  No results found.   Assessment & Plan  I personally reviewed all images and lab data in the [facility] system as  well as any outside material available during this office visit and agree with the  radiology impressions.   No problem-specific Assessment & Plan notes found for this encounter.      There are no diagnoses linked to this encounter.    30 minutes spent extra time needed for blood pressure assessment other multisystem concerns     No follow-ups on file.

## 2023-07-15 ENCOUNTER — Other Ambulatory Visit: Payer: Self-pay

## 2023-07-15 ENCOUNTER — Ambulatory Visit: Payer: Self-pay | Attending: Critical Care Medicine | Admitting: Critical Care Medicine

## 2023-07-15 ENCOUNTER — Encounter: Payer: Self-pay | Admitting: Hematology and Oncology

## 2023-07-15 ENCOUNTER — Encounter: Payer: Self-pay | Admitting: Critical Care Medicine

## 2023-07-15 VITALS — BP 127/85 | HR 63 | Ht 63.0 in | Wt 150.4 lb

## 2023-07-15 DIAGNOSIS — N959 Unspecified menopausal and perimenopausal disorder: Secondary | ICD-10-CM | POA: Insufficient documentation

## 2023-07-15 DIAGNOSIS — I10 Essential (primary) hypertension: Secondary | ICD-10-CM

## 2023-07-15 DIAGNOSIS — E7800 Pure hypercholesterolemia, unspecified: Secondary | ICD-10-CM

## 2023-07-15 DIAGNOSIS — N920 Excessive and frequent menstruation with regular cycle: Secondary | ICD-10-CM

## 2023-07-15 DIAGNOSIS — M791 Myalgia, unspecified site: Secondary | ICD-10-CM

## 2023-07-15 DIAGNOSIS — D500 Iron deficiency anemia secondary to blood loss (chronic): Secondary | ICD-10-CM

## 2023-07-15 DIAGNOSIS — N951 Menopausal and female climacteric states: Secondary | ICD-10-CM

## 2023-07-15 MED ORDER — ATORVASTATIN CALCIUM 10 MG PO TABS
10.0000 mg | ORAL_TABLET | Freq: Every day | ORAL | 3 refills | Status: DC
  Filled 2023-07-15: qty 90, 90d supply, fill #0

## 2023-07-15 MED ORDER — SERTRALINE HCL 50 MG PO TABS
50.0000 mg | ORAL_TABLET | Freq: Every day | ORAL | 3 refills | Status: AC
  Filled 2023-07-15: qty 30, 30d supply, fill #0
  Filled 2024-02-01 (×2): qty 30, 30d supply, fill #1

## 2023-07-15 MED ORDER — CYCLOBENZAPRINE HCL 10 MG PO TABS
5.0000 mg | ORAL_TABLET | Freq: Two times a day (BID) | ORAL | 1 refills | Status: AC | PRN
  Filled 2023-07-15: qty 30, 30d supply, fill #0

## 2023-07-15 MED ORDER — AMLODIPINE BESYLATE 10 MG PO TABS
10.0000 mg | ORAL_TABLET | Freq: Every day | ORAL | 1 refills | Status: DC
  Filled 2023-07-15 – 2023-08-04 (×2): qty 90, 90d supply, fill #0
  Filled 2023-10-17: qty 90, 90d supply, fill #1

## 2023-07-15 NOTE — Assessment & Plan Note (Signed)
Reassess iron levels and CBC

## 2023-07-15 NOTE — Assessment & Plan Note (Signed)
Continue with lipid therapy 

## 2023-07-15 NOTE — Assessment & Plan Note (Signed)
 Check FSH LH  Start sertraline low-dose 50 mg daily

## 2023-07-15 NOTE — Patient Instructions (Addendum)
 Start sertraline 1 daily you can take this at night this is for your menopausal symptoms and your mood condition  Atorvastatin  and amlodipine  refills were given  Labs will be obtained today to check your iron  levels and hormone levels  Return in follow-up with primary care 3 months we will call you with your lab results and call us  if the sertraline is causing too much sedation or if you are having other side effects we will stay on the same dose for now  Comience con sertralina una vez al da. Puede tomarla por la noche. Esto es para sus sntomas de la menopausia y su estado de nimo.  Se le resurtieron las dosis de atorvastatina y amlodipino.  Hoy se le realizarn anlisis de laboratorio para verificar sus niveles de hierro y hormonas.  Regrese a su cita de seguimiento con atencin primaria dentro de 3 meses. Le llamaremos con los resultados de sus [place]. Si la sertralina le est causando demasiada sedacin o si presenta otros efectos secundarios, nos comunicaremos con usted. Mantendremos la misma dosis por ahora.

## 2023-07-15 NOTE — Assessment & Plan Note (Signed)
 Hypertension well-controlled will continue with amlodipine  10 mg daily

## 2023-07-15 NOTE — Assessment & Plan Note (Signed)
 No longer having bleeding issues status post hysterectomy

## 2023-07-16 ENCOUNTER — Ambulatory Visit: Payer: Self-pay | Admitting: Critical Care Medicine

## 2023-07-16 LAB — CBC WITH DIFFERENTIAL/PLATELET
Basophils Absolute: 0 10*3/uL (ref 0.0–0.2)
Basos: 0 %
EOS (ABSOLUTE): 0.3 10*3/uL (ref 0.0–0.4)
Eos: 5 %
Hematocrit: 42.5 % (ref 34.0–46.6)
Hemoglobin: 14 g/dL (ref 11.1–15.9)
Immature Grans (Abs): 0 10*3/uL (ref 0.0–0.1)
Immature Granulocytes: 0 %
Lymphocytes Absolute: 2.7 10*3/uL (ref 0.7–3.1)
Lymphs: 40 %
MCH: 31.1 pg (ref 26.6–33.0)
MCHC: 32.9 g/dL (ref 31.5–35.7)
MCV: 94 fL (ref 79–97)
Monocytes Absolute: 0.5 10*3/uL (ref 0.1–0.9)
Monocytes: 7 %
Neutrophils Absolute: 3.2 10*3/uL (ref 1.4–7.0)
Neutrophils: 48 %
Platelets: 339 10*3/uL (ref 150–450)
RBC: 4.5 x10E6/uL (ref 3.77–5.28)
RDW: 12.6 % (ref 11.7–15.4)
WBC: 6.7 10*3/uL (ref 3.4–10.8)

## 2023-07-16 LAB — IRON,TIBC AND FERRITIN PANEL
Ferritin: 326 ng/mL — ABNORMAL HIGH (ref 15–150)
Iron Saturation: 31 % (ref 15–55)
Iron: 71 ug/dL (ref 27–159)
Total Iron Binding Capacity: 227 ug/dL — ABNORMAL LOW (ref 250–450)
UIBC: 156 ug/dL (ref 131–425)

## 2023-07-16 LAB — FSH/LH
FSH: 7.1 m[IU]/mL
LH: 6.1 m[IU]/mL

## 2023-08-05 ENCOUNTER — Encounter: Payer: Self-pay | Admitting: Hematology and Oncology

## 2023-08-05 ENCOUNTER — Other Ambulatory Visit: Payer: Self-pay

## 2023-08-10 ENCOUNTER — Other Ambulatory Visit (HOSPITAL_COMMUNITY): Payer: Self-pay

## 2023-08-10 ENCOUNTER — Other Ambulatory Visit: Payer: Self-pay

## 2023-10-18 ENCOUNTER — Encounter: Payer: Self-pay | Admitting: Hematology and Oncology

## 2023-10-18 ENCOUNTER — Other Ambulatory Visit: Payer: Self-pay

## 2023-10-20 ENCOUNTER — Telehealth: Payer: Self-pay | Admitting: Critical Care Medicine

## 2023-10-20 NOTE — Telephone Encounter (Signed)
 Patient confirmed appointment for 10/22/2023, through volunteer call.

## 2023-10-22 ENCOUNTER — Encounter: Payer: Self-pay | Admitting: Internal Medicine

## 2023-10-22 ENCOUNTER — Other Ambulatory Visit: Payer: Self-pay

## 2023-10-22 ENCOUNTER — Ambulatory Visit: Payer: Self-pay | Attending: Internal Medicine | Admitting: Internal Medicine

## 2023-10-22 VITALS — BP 130/80 | HR 67 | Ht 63.0 in | Wt 147.0 lb

## 2023-10-22 DIAGNOSIS — N83202 Unspecified ovarian cyst, left side: Secondary | ICD-10-CM

## 2023-10-22 DIAGNOSIS — R5383 Other fatigue: Secondary | ICD-10-CM

## 2023-10-22 DIAGNOSIS — Z1331 Encounter for screening for depression: Secondary | ICD-10-CM

## 2023-10-22 DIAGNOSIS — R0683 Snoring: Secondary | ICD-10-CM

## 2023-10-22 DIAGNOSIS — R1032 Left lower quadrant pain: Secondary | ICD-10-CM

## 2023-10-22 DIAGNOSIS — Z23 Encounter for immunization: Secondary | ICD-10-CM

## 2023-10-22 DIAGNOSIS — E782 Mixed hyperlipidemia: Secondary | ICD-10-CM

## 2023-10-22 DIAGNOSIS — I10 Essential (primary) hypertension: Secondary | ICD-10-CM

## 2023-10-22 MED ORDER — AMLODIPINE BESYLATE 10 MG PO TABS
10.0000 mg | ORAL_TABLET | Freq: Every day | ORAL | 1 refills | Status: AC
  Filled 2024-02-01 (×2): qty 90, 90d supply, fill #0

## 2023-10-22 NOTE — Patient Instructions (Signed)
 VISIT SUMMARY:  Today, we reviewed your hypertension and hyperlipidemia management, discussed your ongoing left lower abdominal pain, addressed your fatigue and potential sleep apnea, and evaluated your emotional health.  YOUR PLAN:  -ESSENTIAL HYPERTENSION: Hypertension means high blood pressure. Your blood pressure readings are generally within the target range. Continue taking amlodipine  10 mg daily, monitor your blood pressure at home, and maintain a low salt diet.  -MIXED HYPERLIPIDEMIA: Hyperlipidemia means high levels of fats in the blood. Continue taking atorvastatin  10 mg daily. We will check your cholesterol levels and liver function with a blood test.  -LEFT LOWER QUADRANT PAIN DUE TO LEFT OVARIAN CYST: Your left lower abdominal pain is likely due to a cyst on your left ovary. We will order a repeat pelvic ultrasound to check the status of the cyst.  -FATIGUE AND SUSPECTED SLEEP APNEA: Sleep apnea is a condition where breathing repeatedly stops and starts during sleep, causing fatigue. We recommend applying for a discount for a sleep study and will refer you for the study after the application.  -MILD DEPRESSION: Mild depression involves feelings of sadness and frequent crying. We will monitor your symptoms and consider antidepressant therapy if they worsen.  INSTRUCTIONS:  Please continue taking your medications as prescribed. Monitor your blood pressure at home and maintain a low salt diet. We will check your cholesterol and liver function with a blood test. Schedule a repeat pelvic ultrasound to assess your ovarian cyst. Apply for a discount for a sleep study, and we will refer you for the study after your application. If your emotional symptoms worsen, please let us  know.

## 2023-10-22 NOTE — Progress Notes (Signed)
 Patient ID: Tracey Gill, female    DOB: May 08, 1980  MRN: [id number]  CC: Establish Care (Est care - (Dr.Wright's pt). Med refill. [place] iron  results - determine if supplement is needed/Experiencing cold episodes, chills, fatigue /Difficulty walking due to lower abdominal pain - previously told there is a mass on L ovary/Flu vax administered on 10/22/2023 - C.A.)   Subjective: Tracey Gill is a 43 y.o. female who presents for chronic ds management. Mariel, from [place], is with her to interpret.  Her concerns today include:  Patient with history of hypertension, iron  deficiency anemia that has resolved post hysterectomy  Discussed the use of AI scribe software for clinical note transcription with the patient, who gave verbal consent to proceed.  History of Present Illness Tracey Gill is a 43 year old female with hypertension and hyperlipidemia who presents for a follow-up visit.  HTN: She has a history of hypertension and is currently taking amlodipine  10 mg daily, usually at lunchtime, but has not taken it today. She monitors her blood pressure twice a week, with systolic readings around 128-130 mmHg, though she does not recall the diastolic readings. She limits her salt intake, opting for coarse salt instead of fine salt.  HL: For hyperlipidemia, she is on atorvastatin  10 mg daily. Her last cholesterol check was in August of the previous year, and she wants her levels rechecked. She has been eating less fried food, particularly on weekdays.  She feels tired and has a frequent desire to cry, though she denies depression. She sleeps from 8 PM to 3 AM, totaling about seven hours, but feels tired upon waking, especially after her hysterectomy last yr. She snores loudly as told by her aunt who has recorded her, but no one has observed apnea. No morning headaches or unexplained weight changes.  She reports left lower abdominal pain since November of the previous  year, following a hospital visit where an ultrasound revealed a complex cyst on her left ovary. The pain worsens with standing and walking. No dysuria or abnormal vaginal discharge.    Patient Active Problem List   Diagnosis Date Noted   Menopausal disorder 07/15/2023   Status post hysterectomy 10/14/2022   Chronic pain of both knees 01/13/2022   Iron  deficiency anemia due to chronic blood loss 08/04/2021   Menorrhagia 06/16/2021   Elevated LDL cholesterol level 10/01/2020   Essential hypertension 09/16/2020   BMI 27.0-27.9,adult 09/16/2020     Current Outpatient Medications on File Prior to Visit  Medication Sig Dispense Refill   atorvastatin  (LIPITOR) 10 MG tablet Take 1 tablet (10 mg total) by mouth daily. 90 tablet 3   cyclobenzaprine  (FLEXERIL ) 10 MG tablet Take 0.5 tablets (5 mg total) by mouth 2 (two) times daily as needed. 30 tablet 1   sertraline  (ZOLOFT ) 50 MG tablet Take 1 tablet (50 mg total) by mouth daily. 30 tablet 3   No current facility-administered medications on file prior to visit.    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  Tobacco Use   Smoking status: Never   Smokeless tobacco: Never  Vaping Use   Vaping status: Never Used  Substance and Sexual Activity   Alcohol use: No   Drug use: No  Sexual activity: Yes  Other Topics Concern   Not on file  Social History Narrative   Not on file   Social Drivers of Health   Financial Resource Strain: Low Risk  (10/22/2023)   Overall Financial Resource Strain (CARDIA)    Difficulty of Paying Living Expenses: Not very hard  Food Insecurity: No Food Insecurity (10/22/2023)   Hunger Vital Sign    Worried About Running Out of Food in the Last Year: Never true    Ran Out of Food in the Last  Year: Never true  Transportation Needs: No Transportation Needs (10/22/2023)   PRAPARE - Administrator, Civil Service (Medical): No    Lack of Transportation (Non-Medical): No  Physical Activity: Inactive (10/22/2023)   Exercise Vital Sign    Days of Exercise per Week: 0 days    Minutes of Exercise per Session: 0 min  Stress: Stress Concern Present (10/22/2023)   [employer] of Occupational Health - Occupational Stress Questionnaire    Feeling of Stress: To some extent  Social Connections: Moderately Isolated (10/22/2023)   Social Connection and Isolation Panel    Frequency of Communication with Friends and Family: Once a week    Frequency of Social Gatherings with Friends and Family: Once a week    Attends Religious Services: 1 to 4 times per year    Active Member of Clubs or Organizations: No    Attends Banker Meetings: 1 to 4 times per year    Marital Status: Never married  Intimate Partner Violence: Not At Risk (10/22/2023)   Humiliation, Afraid, Rape, and Kick questionnaire    Fear of Current or Ex-Partner: No    Emotionally Abused: No    Physically Abused: No    Sexually Abused: No    Family History  Problem Relation Age of Onset   Diabetes Mother    Diabetes Father    Heart disease Father    Diabetes Sister     Past Surgical History:  Procedure Laterality Date   CYSTOSCOPY N/A 06/03/2022   Procedure: CYSTOSCOPY;  Surgeon: Jeralyn Crutch, MD;  Location: [facility] OR;  Service: Gynecology;  Laterality: N/A;   HYSTERECTOMY ABDOMINAL WITH SALPINGECTOMY Bilateral 06/03/2022   Procedure: HYSTERECTOMY ABDOMINAL WITH SALPINGECTOMY;  Surgeon: Jeralyn Crutch, MD;  Location: [facility] OR;  Service: Gynecology;  Laterality: Bilateral;  MINI LAPAROTOMY FOR UTERUS REMOVAL   removal of benign right breast mass Left    ROBOTIC ASSISTED TOTAL HYSTERECTOMY Bilateral 06/03/2022   Procedure: XI ROBOTIC ASSISTED TOTAL HYSTERECTOMY WITH SALPINGECTOMY   WITH MINI LAPAROTOMY;   Surgeon: Jeralyn Crutch, MD;  Location: [facility] OR;  Service: Gynecology;  Laterality: Bilateral;  TAP BLOCK Wants the 30 degree robotic scope and the vessel sealer    ROS: Review of Systems Negative except as stated above  PHYSICAL EXAM: BP 130/80   Pulse 67   Ht 5' 3 (1.6 m)   Wt 147 lb (66.7 kg)   LMP 04/13/2022 (Within Weeks)   SpO2 98%   BMI 26.04 kg/m   Physical Exam  General appearance - alert, well appearing, and in no distress Mental status - normal mood, behavior, speech, dress, motor activity, and thought processes Eyes - pink conjuctiva Chest - clear to auscultation, no wheezes, rales or rhonchi, symmetric air entry Heart - normal rate, regular rhythm, normal S1, S2, no murmurs, rubs, clicks or gallops Abdomen -normal bowel sounds.  Soft.  Nondistended.  Left lower quadrant tenderness without guarding or rebound.  No abnormal masses  felt.    10/22/2023    1:52 PM 07/15/2023    2:19 PM 02/25/2023    5:30 PM  Depression screen PHQ 2/9  Decreased Interest 1 2 0  Down, Depressed, Hopeless 1 2 0  PHQ - 2 Score 2 4 0  Altered sleeping 2 2 1   Tired, decreased energy 2 3 1   Change in appetite 2 2 1   Feeling bad or failure about yourself  0 0 0  Trouble concentrating 1 0 0  Moving slowly or fidgety/restless 0 0 0  Suicidal thoughts 0 0 0  PHQ-9 Score 9 11 3   Difficult doing work/chores Not difficult at all Somewhat difficult        Latest Ref Rng & Units 01/09/2023   11:47 PM 10/13/2022    4:10 PM 08/17/2022    1:39 PM  CMP  Glucose 70 - 99 mg/dL 888  94  876   BUN 6 - 20 mg/dL 19  12  10    Creatinine 0.44 - 1.00 mg/dL 9.34  9.43  9.50   Sodium 135 - 145 mmol/L 134  141  138   Potassium 3.5 - 5.1 mmol/L 3.5  3.9  3.4   Chloride 98 - 111 mmol/L 101  102  104   CO2 22 - 32 mmol/L 25  25  28    Calcium  8.9 - 10.3 mg/dL 8.7  9.2  8.3   Total Protein 6.5 - 8.1 g/dL 8.1  6.9  6.7   Total Bilirubin <1.2 mg/dL 0.4  <9.7  0.3   Alkaline Phos 38 - 126 U/L 59  62  57    AST 15 - 41 U/L 18  15  15    ALT 0 - 44 U/L 18  15  18     Lipid Panel     Component Value Date/Time   CHOL 242 (H) 10/13/2022 1610   TRIG 222 (H) 10/13/2022 1610   HDL 47 10/13/2022 1610   CHOLHDL 5.1 (H) 10/13/2022 1610   CHOLHDL 5.4 Ratio 05/10/2009 1951   VLDL 41 (H) 05/10/2009 1951   LDLCALC 154 (H) 10/13/2022 1610    CBC    Component Value Date/Time   WBC 6.7 07/15/2023 1442   WBC 8.8 01/09/2023 2347   RBC 4.50 07/15/2023 1442   RBC 4.59 01/09/2023 2347   HGB 14.0 07/15/2023 1442   HCT 42.5 07/15/2023 1442   PLT 339 07/15/2023 1442   MCV 94 07/15/2023 1442   MCH 31.1 07/15/2023 1442   MCH 31.4 01/09/2023 2347   MCHC 32.9 07/15/2023 1442   MCHC 34.5 01/09/2023 2347   RDW 12.6 07/15/2023 1442   LYMPHSABS 2.7 07/15/2023 1442   MONOABS 0.4 08/17/2022 1339   EOSABS 0.3 07/15/2023 1442   BASOSABS 0.0 07/15/2023 1442    ASSESSMENT AND PLAN: 1. Essential hypertension Repeat blood pressure close to goal.  Continue amlodipine  10 mg daily and low-salt diet. - amLODipine  (NORVASC ) 10 MG tablet; Take 1 tablet (10 mg total) by mouth daily.Must have office visit for refills  Dispense: 90 tablet; Refill: 1  2. Mixed hyperlipidemia (Primary) Continue atorvastatin  - Lipid panel - Hepatic Function Panel  3. Loud snoring She may have some underlying sleep apnea.  Advised her to apply for the colon discomfort once approved let me know so that we can refer for sleep test.  4. Other fatigue - CBC - TSH+T4F+T3Free  5. Positive depression screening Patient denies feeling depressed despite having a positive depression screen.  She needs medication or  counseling at this time.  6. Cyst of left ovary 7. Left lower quadrant abdominal pain -Will evaluate with a pelvic ultrasound to see if the cyst has resolved. - US  Pelvis Complete; Future  8. Need for immunization against influenza - Flu vaccine trivalent PF, 6mos and older(Flulaval,Afluria,Fluarix,Fluzone)  Patient was  given the opportunity to ask questions.  Patient verbalized understanding of the plan and was able to repeat key elements of the plan.   This documentation was completed using Paediatric nurse.  Any transcriptional errors are unintentional.  Orders Placed This Encounter  Procedures   US  Pelvis Complete   Flu vaccine trivalent PF, 6mos and older(Flulaval,Afluria,Fluarix,Fluzone)   Lipid panel   Hepatic Function Panel   CBC   TSH+T4F+T3Free     Requested Prescriptions   Signed Prescriptions Disp Refills   amLODipine  (NORVASC ) 10 MG tablet 90 tablet 1    Sig: Take 1 tablet (10 mg total) by mouth daily.Must have office visit for refills    Return in about 4 months (around 02/21/2024).  Barnie Louder, MD, FACP

## 2023-10-23 ENCOUNTER — Ambulatory Visit: Payer: Self-pay | Admitting: Internal Medicine

## 2023-10-23 LAB — CBC
Hematocrit: 43.2 % (ref 34.0–46.6)
Hemoglobin: 14.5 g/dL (ref 11.1–15.9)
MCH: 31.2 pg (ref 26.6–33.0)
MCHC: 33.6 g/dL (ref 31.5–35.7)
MCV: 93 fL (ref 79–97)
Platelets: 301 x10E3/uL (ref 150–450)
RBC: 4.65 x10E6/uL (ref 3.77–5.28)
RDW: 12.3 % (ref 11.7–15.4)
WBC: 7.2 x10E3/uL (ref 3.4–10.8)

## 2023-10-23 LAB — LIPID PANEL
Chol/HDL Ratio: 4.1 ratio (ref 0.0–4.4)
Cholesterol, Total: 234 mg/dL — ABNORMAL HIGH (ref 100–199)
HDL: 57 mg/dL (ref >39)
LDL Chol Calc (NIH): 155 mg/dL — ABNORMAL HIGH (ref 0–99)
Triglycerides: 122 mg/dL (ref 0–149)
VLDL Cholesterol Cal: 22 mg/dL (ref 5–40)

## 2023-10-23 LAB — TSH+T4F+T3FREE
Free T4: 1.03 ng/dL (ref 0.82–1.77)
T3, Free: 2.9 pg/mL (ref 2.0–4.4)
TSH: 1.4 u[IU]/mL (ref 0.450–4.500)

## 2023-10-23 LAB — HEPATIC FUNCTION PANEL
ALT: 16 IU/L (ref 0–32)
AST: 17 IU/L (ref 0–40)
Albumin: 4.7 g/dL (ref 3.9–4.9)
Alkaline Phosphatase: 70 IU/L (ref 44–121)
Bilirubin Total: 0.4 mg/dL (ref 0.0–1.2)
Bilirubin, Direct: 0.13 mg/dL (ref 0.00–0.40)
Total Protein: 7 g/dL (ref 6.0–8.5)

## 2023-10-26 ENCOUNTER — Ambulatory Visit (HOSPITAL_COMMUNITY): Payer: Self-pay

## 2023-10-27 ENCOUNTER — Encounter: Payer: Self-pay | Admitting: Hematology and Oncology

## 2023-10-27 ENCOUNTER — Other Ambulatory Visit: Payer: Self-pay

## 2023-10-27 ENCOUNTER — Other Ambulatory Visit: Payer: Self-pay | Admitting: Internal Medicine

## 2023-10-27 MED ORDER — ATORVASTATIN CALCIUM 20 MG PO TABS
20.0000 mg | ORAL_TABLET | Freq: Every day | ORAL | 2 refills | Status: AC
  Filled 2023-10-27: qty 90, 90d supply, fill #0
  Filled 2024-02-01 (×2): qty 90, 90d supply, fill #1

## 2023-10-28 ENCOUNTER — Other Ambulatory Visit: Payer: Self-pay

## 2023-10-29 ENCOUNTER — Ambulatory Visit (HOSPITAL_COMMUNITY): Payer: Self-pay

## 2023-11-02 ENCOUNTER — Ambulatory Visit (HOSPITAL_COMMUNITY)
Admission: RE | Admit: 2023-11-02 | Discharge: 2023-11-02 | Disposition: A | Discharge status: Home / Self Care | Payer: Self-pay | Source: Ambulatory Visit | Attending: Internal Medicine | Admitting: Internal Medicine

## 2023-11-02 DIAGNOSIS — N83202 Unspecified ovarian cyst, left side: Secondary | ICD-10-CM | POA: Insufficient documentation

## 2024-02-01 ENCOUNTER — Encounter: Payer: Self-pay | Admitting: Hematology and Oncology

## 2024-02-01 ENCOUNTER — Other Ambulatory Visit: Payer: Self-pay

## 2024-02-02 ENCOUNTER — Other Ambulatory Visit: Payer: Self-pay

## 2024-02-21 ENCOUNTER — Ambulatory Visit: Payer: Self-pay | Attending: Internal Medicine | Admitting: Internal Medicine

## 2024-02-21 VITALS — BP 124/76 | HR 71 | Temp 97.5°F | Ht 63.0 in | Wt 146.0 lb

## 2024-02-21 DIAGNOSIS — E782 Mixed hyperlipidemia: Secondary | ICD-10-CM

## 2024-02-21 DIAGNOSIS — N644 Mastodynia: Secondary | ICD-10-CM

## 2024-02-21 DIAGNOSIS — I10 Essential (primary) hypertension: Secondary | ICD-10-CM

## 2024-02-21 NOTE — Patient Instructions (Signed)
" °  VISIT SUMMARY: Today, we reviewed your chronic medical conditions and addressed your recent breast pain. Your blood pressure is well-controlled, and we discussed your cholesterol management. We also planned further evaluation for your breast pain.  YOUR PLAN: -MASTALGIA: Mastalgia means breast pain. You have been experiencing sharp pain in your left breast for the past two to three weeks. There is no mass detected and no family history of breast cancer. We have ordered a mammogram to screen for breast cancer and further evaluate the cause of your pain.  -ESSENTIAL HYPERTENSION: Essential hypertension is high blood pressure with no identifiable cause. Your blood pressure is well-controlled at 124/78 mmHg with your current medication, amlodipine  10 mg daily. Please continue taking your medication as prescribed and keep limiting your salt intake.  -MIXED HYPERLIPIDEMIA: Mixed hyperlipidemia is a condition where you have high levels of different types of fats in your blood. Your cholesterol level is currently 155 mg/dL, and our goal is to reduce it to below 100 mg/dL. You should continue taking atorvastatin  20 mg daily. We will reassess your cholesterol levels in 6-8 months to consider if you can discontinue the medication.  INSTRUCTIONS: Please schedule a mammogram as soon as possible to evaluate your breast pain. Continue taking your medications as prescribed and follow the dietary recommendations. We will reassess your cholesterol levels in 6-8 months.                      Contains text generated by Abridge.                                 Contains text generated by Abridge.   "

## 2024-02-21 NOTE — Progress Notes (Signed)
 "   Patient ID: Tracey Gill, female    DOB: Jun 04, 1980  MRN: [id number]  CC: Hypertension (HTN f/u./Intermittent sudden sharp pain on LT breast X2-3 weeks/Already recevied flu vax. Yes to mammogram)   Subjective: Tracey Gill is a 44 y.o. female who presents for chronic ds management. Her concerns today include:  Patient with history of hypertension, iron  deficiency anemia that has resolved post hysterectomy   [employer] Language interpreter used during this encounter. #[id number], Montie   Discussed the use of AI scribe software for clinical note transcription with the patient, who gave verbal consent to proceed.  History of Present Illness Tracey Gill is a 44 year old female who presents for follow-up of her chronic medical conditions.  HTN: Her blood pressure is well-controlled with a current reading of 124/78 mmHg. She continues to take amlodipine  10 mg daily and is mindful of limiting salt intake in her diet. No CP/SOB/LE edema.  HL: Regarding her hyperlipidemia, she is on atorvastatin  20 mg daily. Her LDL cholesterol level was 844 mg/dL four months ago, with a goal to reduce it to below 100 mg/dL. We increased the Atorvastatin  to 20 mg at that time; taking consistently.  Inquires about whether she would need to be on the medication lifelong.  She has been experiencing sharp pains in her left breast for the past two to three weeks. There is no family history of breast cancer.     Patient Active Problem List   Diagnosis Date Noted   Menopausal disorder 07/15/2023   Status post hysterectomy 10/14/2022   Chronic pain of both knees 01/13/2022   Iron  deficiency anemia due to chronic blood loss 08/04/2021   Menorrhagia 06/16/2021   Elevated LDL cholesterol level 10/01/2020   Essential hypertension 09/16/2020   BMI 27.0-27.9,adult 09/16/2020     Medications Ordered Prior to Encounter[1]  Allergies[2]  Social History   Socioeconomic History   Marital  status: Married    Spouse name: Not on file   Number of children: 3   Years of education: Not on file   Highest education level: Not on file  Occupational History   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  Tobacco Use   Smoking status: Never   Smokeless tobacco: Never  Vaping Use   Vaping status: Never Used  Substance and Sexual Activity   Alcohol use: No   Drug use: No   Sexual activity: Yes  Other Topics Concern   Not on file  Social History Narrative   Not on file   Social Drivers of Health   Tobacco Use: Low Risk (10/22/2023)   Patient History    Smoking Tobacco Use: Never    Smokeless Tobacco Use: Never    Passive Exposure: Not on file  Financial Resource Strain: Low Risk (10/22/2023)   Overall Financial Resource Strain (CARDIA)    Difficulty of Paying Living Expenses: Not very hard  Food Insecurity: No Food Insecurity (10/22/2023)   Epic    Worried About Radiation Protection Practitioner of Food in the Last Year: Never true    Ran Out of Food in the Last Year: Never true  Transportation Needs: No Transportation Needs (10/22/2023)   Epic    Lack of Transportation (Medical): No    Lack of Transportation (Non-Medical): No  Physical Activity: Inactive (10/22/2023)   Exercise Vital Sign    Days of Exercise per Week: 0 days    Minutes of Exercise per Session: 0 min  Stress: Stress Concern Present (10/22/2023)   [employer] Occupational [employer] -  Occupational Stress Questionnaire    Feeling of Stress: To some extent  Social Connections: Moderately Isolated (10/22/2023)   Social Connection and Isolation Panel    Frequency of Communication with Friends and Family: Once a week    Frequency of Social Gatherings with Friends and Family: Once a week    Attends Religious Services: 1 to 4 times per year    Active Member of Clubs or Organizations: No    Attends Banker Meetings: 1 to 4 times per year    Marital Status: Never married  Intimate Partner Violence: Not At Risk (10/22/2023)   Epic    Fear  of Current or Ex-Partner: No    Emotionally Abused: No    Physically Abused: No    Sexually Abused: No  Depression (PHQ2-9): Medium Risk (10/22/2023)   Depression (PHQ2-9)    PHQ-2 Score: 9  Alcohol Screen: Low Risk (10/22/2023)   Alcohol Screen    Last Alcohol Screening Score (AUDIT): 1  Housing: Low Risk (10/22/2023)   Epic    Unable to Pay for Housing in the Last Year: No    Number of Times Moved in the Last Year: 0    Homeless in the Last Year: No  Utilities: Not At Risk (10/22/2023)   Epic    Threatened with loss of utilities: No  Health Literacy: Adequate Health Literacy (10/22/2023)   B1300 Health Literacy    Frequency of need for help with medical instructions: Never    Family History  Problem Relation Age of Onset   Diabetes Mother    Diabetes Father    Heart disease Father    Diabetes Sister     Past Surgical History:  Procedure Laterality Date   CYSTOSCOPY N/A 06/03/2022   Procedure: CYSTOSCOPY;  Surgeon: Jeralyn Crutch, MD;  Location: [facility] OR;  Service: Gynecology;  Laterality: N/A;   HYSTERECTOMY ABDOMINAL WITH SALPINGECTOMY Bilateral 06/03/2022   Procedure: HYSTERECTOMY ABDOMINAL WITH SALPINGECTOMY;  Surgeon: Jeralyn Crutch, MD;  Location: [facility] OR;  Service: Gynecology;  Laterality: Bilateral;  MINI LAPAROTOMY FOR UTERUS REMOVAL   removal of benign right breast mass Left    ROBOTIC ASSISTED TOTAL HYSTERECTOMY Bilateral 06/03/2022   Procedure: XI ROBOTIC ASSISTED TOTAL HYSTERECTOMY WITH SALPINGECTOMY   WITH MINI LAPAROTOMY;  Surgeon: Jeralyn Crutch, MD;  Location: [facility] OR;  Service: Gynecology;  Laterality: Bilateral;  TAP BLOCK Wants the 30 degree robotic scope and the vessel sealer    ROS: Review of Systems Negative except as stated above  PHYSICAL EXAM: BP 124/76 (BP Location: Left Arm, Patient Position: Sitting, Cuff Size: Normal)   Pulse 71   Temp (!) 97.5 F (36.4 C) (Oral)   Ht 5' 3 (1.6 m)   Wt 146 lb (66.2 kg)   LMP 04/13/2022   SpO2 100%   BMI  25.86 kg/m   Physical Exam  General appearance - alert, well appearing, and in no distress Mental status - normal mood, behavior, speech, dress, motor activity, and thought processes Eyes - pupils equal and reactive, extraocular eye movements intact Mouth - mucous membranes moist, pharynx normal without lesions Chest - clear to auscultation, no wheezes, rales or rhonchi, symmetric air entry Heart - normal rate, regular rhythm, normal S1, S2, no murmurs, rubs, clicks or gallops Extremities - peripheral pulses normal, no pedal edema, no clubbing or cyanosis Breast exam: CMA Clarisa present-breasts appear symmetric.  No palpable masses or nipple discharge.  No axillary lymphadenopathy.      Latest Ref Rng & Units 10/22/2023  2:41 PM 01/09/2023   11:47 PM 10/13/2022    4:10 PM  CMP  Glucose 70 - 99 mg/dL  888  94   BUN 6 - 20 mg/dL  19  12   Creatinine 9.55 - 1.00 mg/dL  9.34  9.43   Sodium 864 - 145 mmol/L  134  141   Potassium 3.5 - 5.1 mmol/L  3.5  3.9   Chloride 98 - 111 mmol/L  101  102   CO2 22 - 32 mmol/L  25  25   Calcium  8.9 - 10.3 mg/dL  8.7  9.2   Total Protein 6.0 - 8.5 g/dL 7.0  8.1  6.9   Total Bilirubin 0.0 - 1.2 mg/dL 0.4  0.4  <9.7   Alkaline Phos 44 - 121 IU/L 70  59  62   AST 0 - 40 IU/L 17  18  15    ALT 0 - 32 IU/L 16  18  15     Lipid Panel     Component Value Date/Time   CHOL 234 (H) 10/22/2023 1441   TRIG 122 10/22/2023 1441   HDL 57 10/22/2023 1441   CHOLHDL 4.1 10/22/2023 1441   CHOLHDL 5.4 Ratio 05/10/2009 1951   VLDL 41 (H) 05/10/2009 1951   LDLCALC 155 (H) 10/22/2023 1441    CBC    Component Value Date/Time   WBC 7.2 10/22/2023 1441   WBC 8.8 01/09/2023 2347   RBC 4.65 10/22/2023 1441   RBC 4.59 01/09/2023 2347   HGB 14.5 10/22/2023 1441   HCT 43.2 10/22/2023 1441   PLT 301 10/22/2023 1441   MCV 93 10/22/2023 1441   MCH 31.2 10/22/2023 1441   MCH 31.4 01/09/2023 2347   MCHC 33.6 10/22/2023 1441   MCHC 34.5 01/09/2023 2347   RDW 12.3  10/22/2023 1441   LYMPHSABS 2.7 07/15/2023 1442   MONOABS 0.4 08/17/2022 1339   EOSABS 0.3 07/15/2023 1442   BASOSABS 0.0 07/15/2023 1442    ASSESSMENT AND PLAN: 1. Essential hypertension (Primary) At goal - Continue amlodipine  10 mg daily.  2. Mixed hyperlipidemia Continue atorvastatin  20 mg daily.  Advised patient that we can plan to recheck lipid profile again in about 6 months.  She does not necessarily need to remain on medication lifelong if she feels she will be able to keep cholesterol under control with diet and exercise.  For now she will continue the atorvastatin   3. Mastalgia We will pursue diagnostic mammogram of the left breast.  Mammogram scholarship given. - MM 3D DIAGNOSTIC MAMMOGRAM UNILATERAL LEFT BREAST; Future  Patient was given the opportunity to ask questions.  Patient verbalized understanding of the plan and was able to repeat key elements of the plan.   This documentation was completed using Paediatric nurse.  Any transcriptional errors are unintentional.  Orders Placed This Encounter  Procedures   MM 3D DIAGNOSTIC MAMMOGRAM UNILATERAL LEFT BREAST     Requested Prescriptions    No prescriptions requested or ordered in this encounter    Return in about 4 months (around 06/20/2024).  Barnie Louder, MD, FACP     [1]  Current Outpatient Medications on File Prior to Visit  Medication Sig Dispense Refill   amLODipine  (NORVASC ) 10 MG tablet Take 1 tablet (10 mg total) by mouth daily.Must have office visit for refills 90 tablet 1   atorvastatin  (LIPITOR) 20 MG tablet Take 1 tablet (20 mg total) by mouth daily. 90 tablet 2   cyclobenzaprine  (FLEXERIL ) 10 MG tablet Take  0.5 tablets (5 mg total) by mouth 2 (two) times daily as needed. 30 tablet 1   sertraline  (ZOLOFT ) 50 MG tablet Take 1 tablet (50 mg total) by mouth daily. 30 tablet 3   No current facility-administered medications on file prior to visit.  [2] No Known Allergies  "

## 2024-02-28 ENCOUNTER — Other Ambulatory Visit: Payer: Self-pay

## 2024-02-28 DIAGNOSIS — N644 Mastodynia: Secondary | ICD-10-CM

## 2024-04-13 ENCOUNTER — Ambulatory Visit: Payer: Self-pay

## 2024-04-13 ENCOUNTER — Other Ambulatory Visit: Payer: Self-pay

## 2024-06-23 ENCOUNTER — Ambulatory Visit: Payer: Self-pay | Admitting: Internal Medicine
